# Patient Record
Sex: Male | Born: 1940 | Marital: Married | Smoking: Never smoker
Health system: Southern US, Community
[De-identification: ages and names within clinical notes are randomized; demographics above are authoritative.]

## PROBLEM LIST (undated history)

## (undated) DIAGNOSIS — E119 Type 2 diabetes mellitus without complications: Secondary | ICD-10-CM

## (undated) DIAGNOSIS — E039 Hypothyroidism, unspecified: Secondary | ICD-10-CM

## (undated) DIAGNOSIS — Z860100 Personal history of colon polyps, unspecified: Secondary | ICD-10-CM

## (undated) DIAGNOSIS — E079 Disorder of thyroid, unspecified: Secondary | ICD-10-CM

## (undated) DIAGNOSIS — M1990 Unspecified osteoarthritis, unspecified site: Secondary | ICD-10-CM

## (undated) DIAGNOSIS — I509 Heart failure, unspecified: Secondary | ICD-10-CM

## (undated) DIAGNOSIS — K635 Polyp of colon: Secondary | ICD-10-CM

## (undated) DIAGNOSIS — I10 Essential (primary) hypertension: Secondary | ICD-10-CM

## (undated) DIAGNOSIS — M2580 Other specified joint disorders, unspecified joint: Secondary | ICD-10-CM

## (undated) DIAGNOSIS — G4730 Sleep apnea, unspecified: Secondary | ICD-10-CM

## (undated) DIAGNOSIS — N400 Enlarged prostate without lower urinary tract symptoms: Secondary | ICD-10-CM

## (undated) DIAGNOSIS — L309 Dermatitis, unspecified: Secondary | ICD-10-CM

## (undated) DIAGNOSIS — B079 Viral wart, unspecified: Secondary | ICD-10-CM

## (undated) HISTORY — DX: Benign prostatic hyperplasia without lower urinary tract symptoms: N40.0

## (undated) HISTORY — PX: HEMORRHOID SURGERY: SHX153

## (undated) HISTORY — DX: Unspecified osteoarthritis, unspecified site: M19.90

## (undated) HISTORY — DX: Personal history of colon polyps, unspecified: Z86.0100

## (undated) HISTORY — PX: HERNIA REPAIR: SHX51

## (undated) HISTORY — PX: BUNIONECTOMY: SHX129

## (undated) HISTORY — DX: Dermatitis, unspecified: L30.9

## (undated) HISTORY — DX: Polyp of colon: K63.5

## (undated) HISTORY — DX: Type 2 diabetes mellitus without complications: E11.9

## (undated) HISTORY — DX: Sleep apnea, unspecified: G47.30

## (undated) HISTORY — DX: Disorder of thyroid, unspecified: E07.9

## (undated) HISTORY — PX: KNEE REPAIR EXTENSOR MECHANISM: SHX6613

## (undated) HISTORY — DX: Viral wart, unspecified: B07.9

## (undated) HISTORY — DX: Hypothyroidism, unspecified: E03.9

## (undated) HISTORY — DX: Other specified joint disorders, unspecified joint: M25.80

## (undated) HISTORY — PX: CHOLECYSTECTOMY: SHX55

## (undated) HISTORY — DX: Heart failure, unspecified: I50.9

## (undated) HISTORY — DX: Essential (primary) hypertension: I10

## (undated) HISTORY — DX: Personal history of colonic polyps: Z86.010

---

## 2017-08-16 HISTORY — PX: COLONOSCOPY: SHX174

## 2018-03-17 ENCOUNTER — Encounter: Payer: Self-pay | Admitting: Podiatry

## 2018-03-17 ENCOUNTER — Ambulatory Visit (INDEPENDENT_AMBULATORY_CARE_PROVIDER_SITE_OTHER): Payer: Medicare Other | Admitting: Podiatry

## 2018-03-17 VITALS — BP 142/76 | HR 64

## 2018-03-17 DIAGNOSIS — E119 Type 2 diabetes mellitus without complications: Secondary | ICD-10-CM

## 2018-03-17 DIAGNOSIS — Q828 Other specified congenital malformations of skin: Secondary | ICD-10-CM | POA: Diagnosis not present

## 2018-03-17 DIAGNOSIS — B351 Tinea unguium: Secondary | ICD-10-CM | POA: Diagnosis not present

## 2018-03-17 DIAGNOSIS — M216X9 Other acquired deformities of unspecified foot: Secondary | ICD-10-CM

## 2018-03-17 DIAGNOSIS — M79674 Pain in right toe(s): Secondary | ICD-10-CM | POA: Diagnosis not present

## 2018-03-17 DIAGNOSIS — M79675 Pain in left toe(s): Secondary | ICD-10-CM

## 2018-03-17 NOTE — Progress Notes (Signed)
This patient presents to the office with chief complaint of long thick nails and diabetic feet.  This patient  says there  is  no pain and discomfort in their feet.  This patient says there are long thick painful nails. He also has painful callus both feet. These nails are painful walking and wearing shoes.  Patient has no history of infection or drainage from both feet.  Patient is unable to  self treat his calluses and nails.  [place] to [place] from [place]. . This patient presents  to the office today for treatment of the  long nails and a foot evaluation due to history of  diabetes.  General Appearance  Alert, conversant and in no acute stress.  Vascular  Dorsalis pedis and posterior tibial  pulses are palpable  bilaterally.  Capillary return is within normal limits  bilaterally. Temperature is within normal limits  bilaterally.  Neurologic  Senn-Weinstein monofilament wire test within normal limits  Left.  LOPS diminished right.  . Muscle power within normal limits bilaterally.  Nails Thick disfigured discolored nails with subungual debris  from hallux to fifth toes bilaterally. No evidence of bacterial infection or drainage bilaterally.  Orthopedic  No limitations of motion of motion feet .  No crepitus or effusions noted.  HAV surgery left foot.  Severe HAV right foot.  Hammer toes 2-5  B/L    Skin  normotropic skin  noted bilaterally.  No signs of infections or ulcers noted.  Callus sub 1 right foot.  Porokeratosis forefoot  B/L.    Onychomycosis  Diabetes with no foot complications  IE  Debride nails x 10. Debride callus  B/l. A diabetic foot exam was performed and there is no evidence of any vascular or neurologic pathology. Prescribed Mobic 15 mg.  Told patient to be seen by Raiford Nobleick to add dispersion in his diabetic insoles.   RTC 3 months.   Helane GuntherGregory Kynsley Whitehouse DPM

## 2018-06-16 ENCOUNTER — Encounter: Payer: Self-pay | Admitting: Podiatry

## 2018-06-16 ENCOUNTER — Ambulatory Visit (INDEPENDENT_AMBULATORY_CARE_PROVIDER_SITE_OTHER): Payer: Medicare Other | Admitting: Podiatry

## 2018-06-16 DIAGNOSIS — M79674 Pain in right toe(s): Secondary | ICD-10-CM | POA: Diagnosis not present

## 2018-06-16 DIAGNOSIS — Q828 Other specified congenital malformations of skin: Secondary | ICD-10-CM | POA: Diagnosis not present

## 2018-06-16 DIAGNOSIS — B351 Tinea unguium: Secondary | ICD-10-CM | POA: Diagnosis not present

## 2018-06-16 DIAGNOSIS — E119 Type 2 diabetes mellitus without complications: Secondary | ICD-10-CM | POA: Diagnosis not present

## 2018-06-16 DIAGNOSIS — M79675 Pain in left toe(s): Secondary | ICD-10-CM

## 2018-06-16 DIAGNOSIS — M216X9 Other acquired deformities of unspecified foot: Secondary | ICD-10-CM

## 2018-06-16 DIAGNOSIS — M2011 Hallux valgus (acquired), right foot: Secondary | ICD-10-CM

## 2018-06-16 MED ORDER — MELOXICAM 15 MG PO TABS: 15.0000 mg | ORAL_TABLET | Freq: Every day | ORAL | 0 refills | Status: AC

## 2018-06-16 NOTE — Progress Notes (Signed)
This patient presents the office today for continued preventative foot care services.  He says that the calluses on the bottom of both feet have become extremely painful.  He says he was unable to consider surgical correction due to his wife's health.  He says that he is now ready to discuss surgery of his feet with a Careers adviser.  He says that the calluses are continued to be painful walking and wearing his shoes.  He also talks about straightening the great toe on his right foot.  He presents the office today and preventative foot care services were provided. Patient is diabetic and says it runs aboyt 115.  No  General Appearance  Alert, conversant and in no acute stress.  Vascular  Dorsalis pedis and posterior tibial  pulses are palpable  bilaterally.  Capillary return is within normal limits  bilaterally. Temperature is within normal limits  bilaterally.  Neurologic  Senn-Weinstein monofilament wire test within normal limits left.  Saint Weinstein monofilament wire test was diminished right foot. . Muscle power within normal limits bilaterally.  Nails Thick disfigured discolored nails with subungual debris  from hallux to fifth toes bilaterally. No evidence of bacterial infection or drainage bilaterally.  Orthopedic  No limitations of motion  feet .  No crepitus or effusions noted.  HAV first MPJ right foot.  Plantarflexed fifth metatarsal heads bilaterally.    Skin  normotropic skin with  noted bilaterally.  No signs of infections or ulcers noted.  Porokeratosis sub 5th metatarsal B/L  Callus sub 1st MPJ  Right.  Onychomycosis  Porokeratosis  Sub 5th metatarsal B/L.  ROV.  Discussed surgical correction with this patient.  This patient will be referred to Dr. Charlsie Merles for surgical consult.  Debridement of nails  X 10.  Debride porokeratosis sub 5th  B/L.  We discussed injection therapy and decided against injection therapy until after evaluation by Dr. Charlsie Merles was performed.  RTC 3 months for preventative  foot care services.   Helane Gunther DPM

## 2018-06-22 ENCOUNTER — Ambulatory Visit: Payer: Medicare Other | Admitting: Podiatry

## 2018-07-04 ENCOUNTER — Telehealth: Payer: Self-pay | Admitting: *Deleted

## 2018-07-04 NOTE — Telephone Encounter (Signed)
error 

## 2018-07-05 ENCOUNTER — Ambulatory Visit (INDEPENDENT_AMBULATORY_CARE_PROVIDER_SITE_OTHER): Payer: Medicare Other

## 2018-07-05 ENCOUNTER — Ambulatory Visit (INDEPENDENT_AMBULATORY_CARE_PROVIDER_SITE_OTHER): Payer: Medicare Other | Admitting: Podiatry

## 2018-07-05 ENCOUNTER — Encounter: Payer: Self-pay | Admitting: Podiatry

## 2018-07-05 DIAGNOSIS — M2011 Hallux valgus (acquired), right foot: Secondary | ICD-10-CM

## 2018-07-05 DIAGNOSIS — M21622 Bunionette of left foot: Secondary | ICD-10-CM | POA: Diagnosis not present

## 2018-07-05 DIAGNOSIS — M21621 Bunionette of right foot: Secondary | ICD-10-CM

## 2018-07-05 DIAGNOSIS — M2012 Hallux valgus (acquired), left foot: Secondary | ICD-10-CM

## 2018-07-05 NOTE — Progress Notes (Signed)
Subjective:   Patient ID: Chad Gibson, male   DOB: 77 y.o.   MRN: [id number]   HPI Patient presents stating this bunion on my right is really bothering me and these lesions underneath the outside of both feet are so sore I have trouble walking or wearing shoe gear and they are just getting worse and I would like to get them fixed.  Patient states sugars been under excellent control with his sugar running around 100 and his A1c around 6-6-1/2   ROS      Objective:  Physical Exam  Neurovascular status intact muscle strength is adequate range of motion within normal limits with patient found to have moderate structural bunion deformity right with keratotic lesion and deformity of the right big toe which does not hurt but does lean against the second toe.  Is found to have severe keratotic lesion sub-fifth metatarsal head both feet that are on the bone with the bone being quite prominent with no fat pad plantarly and a lot of pain with palpation     Assessment:  Moderate structural bunion deformity right along with plantarflexed fifth metatarsal bilateral keratotic lesions that are painful     Plan:  H&P conditions reviewed at this time.  I recommended bunion correction I did discuss bunion procedure to try to solve this problem explaining that will not completely resolve his issues with the first MPJ but I am hoping will be of help and also fifth metatarsal head resection.  I educated him all of this and patient is scheduled for consult January is tentatively scheduled for surgery mid January  X-rays indicate previous bunion surgery left with screw fixation with structural bunion deformity right with significant deformity of the right hallux that I do not consider correctable at the current time.  Appears to have plantarflexed fifth metatarsals bilateral

## 2018-08-21 ENCOUNTER — Encounter: Payer: Self-pay | Admitting: *Deleted

## 2018-08-21 NOTE — Progress Notes (Signed)
Chad Gibson came by the office to reschedule his surgery from January 14 to March 24.  I also rescheduled his preoperative appointment from August 23, 2018 to October 23, 2018.

## 2018-08-23 ENCOUNTER — Ambulatory Visit: Payer: Medicare Other | Admitting: Podiatry

## 2018-09-07 ENCOUNTER — Other Ambulatory Visit: Payer: Medicare Other

## 2018-09-12 ENCOUNTER — Other Ambulatory Visit: Payer: Self-pay

## 2018-09-12 ENCOUNTER — Telehealth: Payer: Self-pay | Admitting: Pulmonary Disease

## 2018-09-12 ENCOUNTER — Emergency Department (HOSPITAL_COMMUNITY): Payer: Medicare Other

## 2018-09-12 ENCOUNTER — Emergency Department (HOSPITAL_COMMUNITY)
Admission: EM | Admit: 2018-09-12 | Discharge: 2018-09-12 | Disposition: A | Discharge status: Home / Self Care | Payer: Medicare Other | Attending: Emergency Medicine | Admitting: Emergency Medicine

## 2018-09-12 ENCOUNTER — Encounter (HOSPITAL_COMMUNITY): Payer: Self-pay | Admitting: Emergency Medicine

## 2018-09-12 DIAGNOSIS — I10 Essential (primary) hypertension: Secondary | ICD-10-CM | POA: Insufficient documentation

## 2018-09-12 DIAGNOSIS — R110 Nausea: Secondary | ICD-10-CM | POA: Diagnosis not present

## 2018-09-12 DIAGNOSIS — R260 Ataxic gait: Secondary | ICD-10-CM | POA: Diagnosis not present

## 2018-09-12 DIAGNOSIS — R42 Dizziness and giddiness: Secondary | ICD-10-CM | POA: Diagnosis not present

## 2018-09-12 DIAGNOSIS — R5383 Other fatigue: Secondary | ICD-10-CM | POA: Insufficient documentation

## 2018-09-12 DIAGNOSIS — Z79899 Other long term (current) drug therapy: Secondary | ICD-10-CM | POA: Diagnosis not present

## 2018-09-12 LAB — BASIC METABOLIC PANEL
Anion gap: 8 (ref 5–15)
BUN: 17 mg/dL (ref 8–23)
CALCIUM: 9.7 mg/dL (ref 8.9–10.3)
CO2: 25 mmol/L (ref 22–32)
Chloride: 104 mmol/L (ref 98–111)
Creatinine, Ser: 0.93 mg/dL (ref 0.61–1.24)
GFR calc Af Amer: >60 mL/min (ref >60)
GFR calc non Af Amer: >60 mL/min (ref >60)
Glucose, Bld: 128 mg/dL — ABNORMAL HIGH (ref 70–99)
Potassium: 3.9 mmol/L (ref 3.5–5.1)
Sodium: 137 mmol/L (ref 135–145)

## 2018-09-12 LAB — URINALYSIS, ROUTINE W REFLEX MICROSCOPIC
Bilirubin Urine: NEGATIVE
Glucose, UA: NEGATIVE mg/dL
HGB URINE DIPSTICK: NEGATIVE
Ketones, ur: NEGATIVE mg/dL
Leukocytes, UA: NEGATIVE
Nitrite: NEGATIVE
Protein, ur: NEGATIVE mg/dL
Specific Gravity, Urine: 1.011 (ref 1.005–1.030)
pH: 5 (ref 5.0–8.0)

## 2018-09-12 LAB — CBC
HCT: 45 % (ref 39.0–52.0)
Hemoglobin: 14.9 g/dL (ref 13.0–17.0)
MCH: 25.3 pg — ABNORMAL LOW (ref 26.0–34.0)
MCHC: 33.1 g/dL (ref 30.0–36.0)
MCV: 76.5 fL — AB (ref 80.0–100.0)
Platelets: 193 10*3/uL (ref 150–400)
RBC: 5.88 MIL/uL — ABNORMAL HIGH (ref 4.22–5.81)
RDW: 17.4 % — ABNORMAL HIGH (ref 11.5–15.5)
WBC: 9.1 10*3/uL (ref 4.0–10.5)
nRBC: 0 % (ref 0.0–0.2)

## 2018-09-12 MED ORDER — ONDANSETRON HCL 4 MG PO TABS: 4.0000 mg | ORAL_TABLET | Freq: Four times a day (QID) | ORAL | 0 refills | Status: DC

## 2018-09-12 MED ORDER — LORAZEPAM 2 MG/ML IJ SOLN
1.0000 mg | Freq: Once | INTRAMUSCULAR | Status: AC
  Administered 2018-09-12: 1 mg via INTRAVENOUS
  Filled 2018-09-12: qty 1

## 2018-09-12 MED ORDER — SODIUM CHLORIDE 0.9% FLUSH
3.0000 mL | Freq: Once | INTRAVENOUS | Status: AC
  Administered 2018-09-12: 3 mL via INTRAVENOUS

## 2018-09-12 MED ORDER — MECLIZINE HCL 25 MG PO TABS: 25.0000 mg | ORAL_TABLET | Freq: Three times a day (TID) | ORAL | 0 refills | Status: AC | PRN

## 2018-09-12 MED ORDER — ONDANSETRON 4 MG PO TBDP
4.0000 mg | ORAL_TABLET | Freq: Once | ORAL | Status: AC
  Administered 2018-09-12: 4 mg via ORAL
  Filled 2018-09-12: qty 1

## 2018-09-12 MED ORDER — MECLIZINE HCL 25 MG PO TABS
25.0000 mg | ORAL_TABLET | Freq: Once | ORAL | Status: AC
  Administered 2018-09-12: 25 mg via ORAL
  Filled 2018-09-12: qty 1

## 2018-09-12 NOTE — ED Notes (Signed)
Patient transported to MRI 

## 2018-09-12 NOTE — ED Triage Notes (Addendum)
Onset 3 days ago developed balance issues stating when walking felt like he was going to the right. Continued over the weekend and today. States at times has nausea and when bending over have general abdominal discomfort. Alert answering and following commands appropriate. States seen 18 months ago for same symptoms and all symptoms resolved.

## 2018-09-12 NOTE — Discharge Instructions (Addendum)
Return here as needed. Follow up with your primary doctor. Increase your fluid intake. °

## 2018-09-12 NOTE — ED Notes (Signed)
Patient verbalizes understanding of discharge instructions. Opportunity for questioning and answers were provided. Armband removed by staff, pt discharged from ED in wheelchair.  

## 2018-09-12 NOTE — ED Notes (Signed)
ED Provider at bedside. 

## 2018-09-12 NOTE — ED Provider Notes (Signed)
[facility] EMERGENCY DEPARTMENT Provider Note   CSN: [id number] Arrival date & time: 09/12/18  7588     History   Chief Complaint Chief Complaint  Patient presents with  . Nausea  . Abdominal Pain  . Fatigue    HPI Chad Gibson is a 78 y.o. male.  HPI Patient presents to the emergency department with dizziness that started 2 days ago.  The patient states that he had this 1 time previously about a year and a half ago and had an extensive work-up.  The patient states that they were unable to find a definitive source.  Patient states that nothing seems make the condition better but certain movements and positions make it worse.  Patient states that he did not take any medications prior to arrival for symptoms.  The patient denies chest pain, shortness of breath, headache,blurred vision, neck pain, fever, cough, weakness, numbness,anorexia, edema, abdominal pain, nausea, vomiting, diarrhea, rash, back pain, dysuria, hematemesis, bloody stool, near syncope, or syncope. Past Medical History:  Diagnosis Date  . Arthritis   . Diabetes mellitus without complication (HCC)   . Eczema   . Hypertension   . Sesamoiditis   . Warts    chronic    There are no active problems to display for this patient.   Past Surgical History:  Procedure Laterality Date  . CHOLECYSTECTOMY    . KNEE REPAIR EXTENSOR MECHANISM          Home Medications    Prior to Admission medications   Medication Sig Start Date End Date Taking? Authorizing Provider  aspirin EC 81 MG tablet Take 81 mg by mouth daily.   Yes [provider]  Cholecalciferol (VITAMIN D) 50 MCG (2000 UT) tablet Take 2,000 Units by mouth daily. 08/14/18  Yes [provider]  docusate calcium (SURFAK) 240 MG capsule Take 240 mg by mouth as needed for mild constipation.   Yes [provider]  finasteride (PROSCAR) 5 MG tablet Take 5 mg by mouth at bedtime.    Yes [provider]    Glycerin-Hypromellose-PEG 400 ([facility] DRY EYE RELIEF) 0.2-0.2-1 % SOLN Place 1 drop into both eyes as needed (Dry eye). refresh   Yes [provider]  hydrochlorothiazide (HYDRODIURIL) 25 MG tablet Take 12.5 mg by mouth daily.  08/14/18  Yes [provider]  hydrocortisone (ANUSOL-HC) 25 MG suppository Place 25 mg rectally as needed for hemorrhoids or anal itching.    Yes [provider]  lactobacillus acidophilus & bulgar (LACTINEX) chewable tablet Chew 1 tablet by mouth daily.   Yes [provider]  Levothyroxine Sodium 100 MCG CAPS Take 100 mcg by mouth daily before breakfast.    Yes [provider]  losartan (COZAAR) 100 MG tablet Take 100 mg by mouth daily. 08/14/18  Yes [provider]  meloxicam (MOBIC) 15 MG tablet Take 1 tablet (15 mg total) by mouth daily. Patient taking differently: Take 15 mg by mouth as needed for pain.  06/16/18  Yes Helane Gunther, DPM  metFORMIN (GLUCOPHAGE) 500 MG tablet Take 500 mg by mouth 2 (two) times daily with a meal.    Yes [provider]  metoprolol tartrate (LOPRESSOR) 50 MG tablet Take 25 mg by mouth 2 (two) times daily. 08/14/18  Yes [provider]  Multiple Vitamin (MULTIVITAMIN WITH MINERALS) TABS tablet Take 1 tablet by mouth daily. Centrum Silver   Yes [provider]  tamsulosin (FLOMAX) 0.4 MG CAPS capsule Take 0.4 mg by mouth  at bedtime.    Yes [provider]  vitamin C (ASCORBIC ACID) 500 MG tablet Take 500 mg by mouth daily.   Yes [provider]  glucose blood test strip 1 each by Other route as needed for other. Use as instructed    [provider]    Family History No family history on file.  Social History Social History   Tobacco Use  . Smoking status: Never Smoker  . Smokeless tobacco: Never Used  Substance Use Topics  . Alcohol use: Never    Frequency: Never  . Drug use: Never     Allergies   Ace  inhibitors   Review of Systems Review of Systems All other systems negative except as documented in the HPI. All pertinent positives and negatives as reviewed in the HPI.  Physical Exam Updated Vital Signs BP (!) 153/94 (BP Location: Left Arm)   Pulse 79   Temp 97.6 F (36.4 C) (Oral)   Resp 16   Ht 6' 1.5" (1.867 m)   Wt 96.2 kg   SpO2 98%   BMI 27.59 kg/m   Physical Exam Vitals signs and nursing note reviewed.  Constitutional:      General: He is not in acute distress.    Appearance: He is well-developed.  HENT:     Head: Normocephalic and atraumatic.  Eyes:     Pupils: Pupils are equal, round, and reactive to light.  Neck:     Musculoskeletal: Normal range of motion and neck supple.  Cardiovascular:     Rate and Rhythm: Normal rate and regular rhythm.     Heart sounds: Normal heart sounds. No murmur. No friction rub. No gallop.   Pulmonary:     Effort: Pulmonary effort is normal. No respiratory distress.     Breath sounds: Normal breath sounds. No wheezing.  Abdominal:     General: Bowel sounds are normal. There is no distension.     Palpations: Abdomen is soft.     Tenderness: There is no abdominal tenderness.  Skin:    General: Skin is warm and dry.     Capillary Refill: Capillary refill takes less than 2 seconds.     Findings: No erythema or rash.  Neurological:     Mental Status: He is alert and oriented to person, place, and time.     GCS: GCS eye subscore is 4. GCS verbal subscore is 5. GCS motor subscore is 6.     Cranial Nerves: No cranial nerve deficit.     Sensory: Sensation is intact.     Motor: Motor function is intact. No weakness or abnormal muscle tone.     Coordination: Coordination is intact. Coordination normal. Heel to Shin Test normal.  Psychiatric:        Behavior: Behavior normal.      ED Treatments / Results  Labs (all labs ordered are listed, but only abnormal results are displayed) Labs Reviewed  BASIC METABOLIC PANEL -  Abnormal; Notable for the following components:      Result Value   Glucose, Bld 128 (*)    All other components within normal limits  CBC - Abnormal; Notable for the following components:   RBC 5.88 (*)    MCV 76.5 (*)    MCH 25.3 (*)    RDW 17.4 (*)    All other components within normal limits  URINE CULTURE  URINALYSIS, ROUTINE W REFLEX MICROSCOPIC  URINALYSIS, ROUTINE W REFLEX MICROSCOPIC    EKG None  Radiology Mr Brain Wo Contrast (neuro Protocol)  Result Date: 09/12/2018 CLINICAL DATA:  Initial evaluation for acute ataxia, stroke suspected. EXAM: MRI HEAD WITHOUT CONTRAST TECHNIQUE: Multiplanar, multiecho pulse sequences of the brain and surrounding structures were obtained without intravenous contrast. COMPARISON:  None. FINDINGS: Brain: Cerebral volume within normal limits for patient age. Patchy T2/FLAIR hyperintensity within the periventricular white matter, most consistent with chronic micro vessel ischemic disease, mild for age. No abnormal foci of restricted diffusion to suggest acute or subacute ischemia. Gray-white matter differentiation well maintained. No encephalomalacia to suggest chronic infarction. No foci of susceptibility artifact to suggest acute or chronic intracranial hemorrhage. Mass lesion, midline shift or mass effect. No hydrocephalus. No extra-axial fluid collection. Major dural sinuses are grossly patent. Pituitary gland and suprasellar region are normal. Midline structures intact and normal. Vascular: Major intracranial vascular flow voids well maintained and normal in appearance. Skull and upper cervical spine: Craniocervical junction normal. Visualized upper cervical spine within normal limits. Bone marrow signal intensity normal. No scalp soft tissue abnormality. Small lipoma noted at the right forehead. Sinuses/Orbits: Globes and orbital soft tissues within normal limits. Paranasal sinuses are clear. No mastoid effusion. Inner ear structures normal. Other:  None. IMPRESSION: 1. No acute intracranial infarct or other abnormality. 2. Mild chronic microvascular ischemic disease for age. Electronically Signed   By: Rise Mu M.D.   On: 09/12/2018 20:33    Procedures Procedures (including critical care time)  Medications Ordered in ED Medications  sodium chloride flush (NS) 0.9 % injection 3 mL (3 mLs Intravenous Given 09/12/18 1817)  LORazepam (ATIVAN) injection 1 mg (1 mg Intravenous Given 09/12/18 1931)     Initial Impression / Assessment and Plan / ED Course  I have reviewed the triage vital signs and the nursing notes.  Pertinent labs & imaging results that were available during my care of the patient were reviewed by me and considered in my medical decision making (see chart for details).     Patient has a negative MRI along with a normal neurological examination.  I was concerned the patient may have had a posterior circulation stroke causing the symptoms.  The MRI did not yield any significant results.  I advised the patient of the results and all questions were answered.  Was advised the patient to follow-up with a primary doctor.  Told to return here as needed.  Final Clinical Impressions(s) / ED Diagnoses   Final diagnoses:  None    ED Discharge Orders    None       Kyra Manges 09/12/18 2055    Arby Barrette, MD 09/12/18 2108

## 2018-09-12 NOTE — ED Notes (Signed)
Pt states he is still unable to urinate.

## 2018-09-12 NOTE — Telephone Encounter (Signed)
Called Dr. Clarene Duke to get some information on the pt as we are not a treating office for this pt. When speaking to Dr. Clarene Duke she states that she is unfamiliar with this pt and did not call on him. Message will be closed at this time.

## 2018-09-13 LAB — URINE CULTURE: CULTURE: NO GROWTH

## 2018-09-22 ENCOUNTER — Ambulatory Visit: Payer: Medicare Other | Admitting: Podiatry

## 2018-10-23 ENCOUNTER — Ambulatory Visit: Payer: Medicare Other | Admitting: Podiatry

## 2018-11-13 ENCOUNTER — Other Ambulatory Visit: Payer: Medicare Other

## 2019-07-25 IMAGING — MR MR HEAD W/O CM
10 of 11 series · 43 of 48 positions shown · non-contrast
Comparison: None.

CLINICAL DATA: Initial evaluation for acute ataxia, stroke
suspected.

EXAM:
MRI HEAD WITHOUT CONTRAST
TECHNIQUE: Multiplanar, multiecho pulse sequences of the brain and surrounding
structures were obtained without intravenous contrast.

[Series 5: DWI · axial · 3.0mm · 0.88mm/px · z∈[-59,+88]mm · 10 of 100 slices shown (1 of 4)]
[im 1/100]
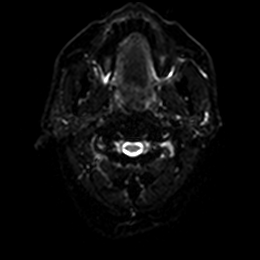
[im 12/100]
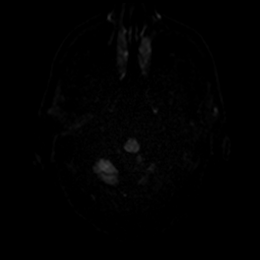
[im 23/100]
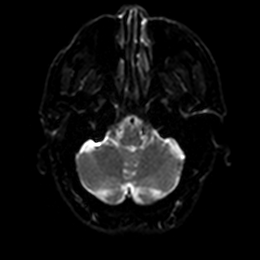
[im 34/100]
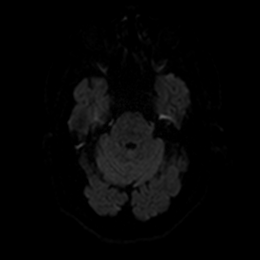
[im 45/100]
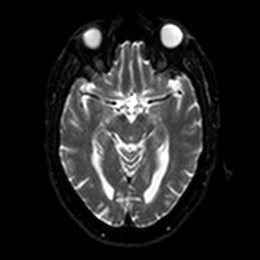
[im 56/100]
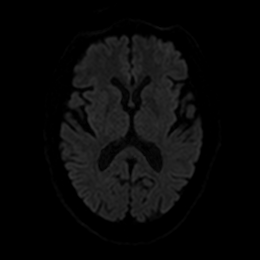
[im 67/100]
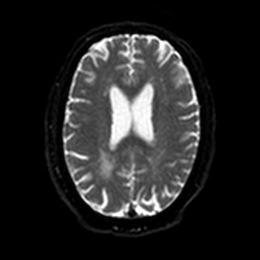
[im 78/100]
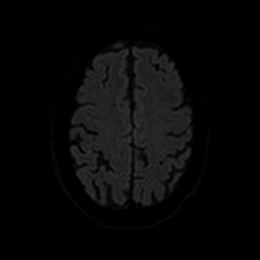
[im 89/100]
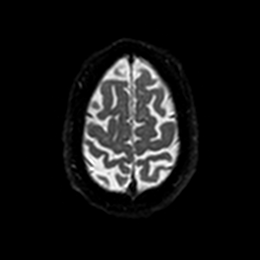
[im 100/100]
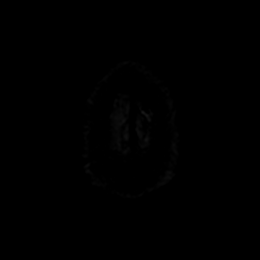

[Series 6: DWI · axial · 3.0mm · 0.88mm/px · z∈[-59,+88]mm · 5 of 50 slices shown (2 of 4)]
[im 1/50]
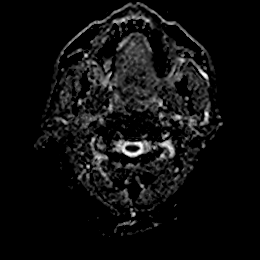
[im 13/50]
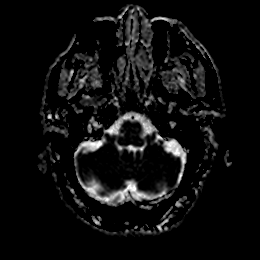
[im 25/50]
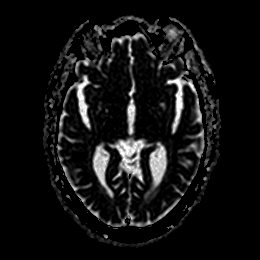
[im 37/50]
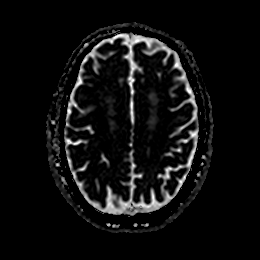
[im 50/50]
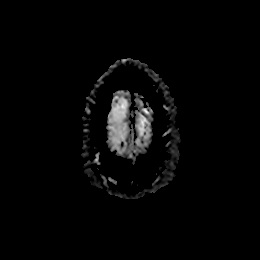

[Series 7: DWI · coronal · 4.0mm · 0.88mm/px · 6 of 70 slices shown (3 of 4)]
[im 1/70]
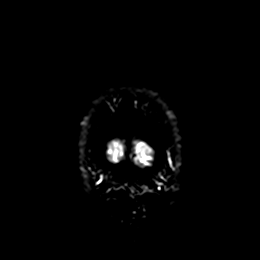
[im 14/70]
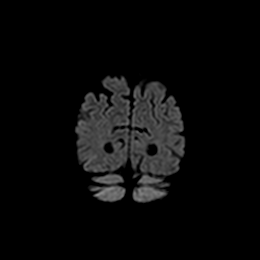
[im 28/70]
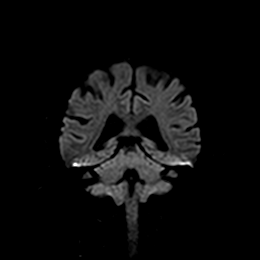
[im 42/70]
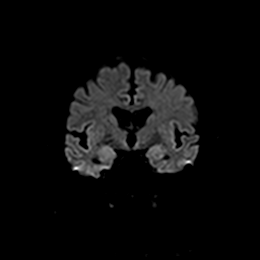
[im 56/70]
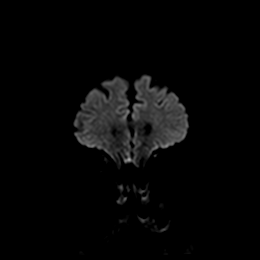
[im 70/70]
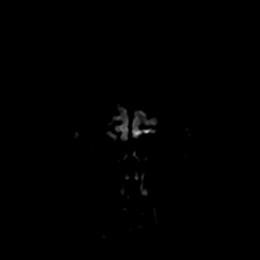

[Series 8: DWI · coronal · 4.0mm · 0.88mm/px · 3 of 35 slices shown (4 of 4)]
[im 1/35]
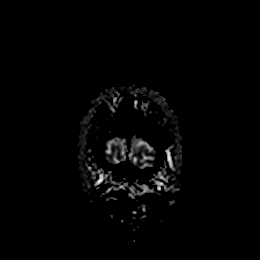
[im 18/35]
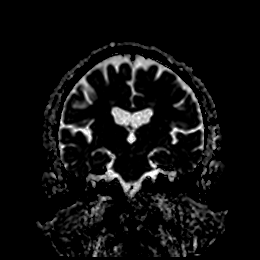
[im 35/35]
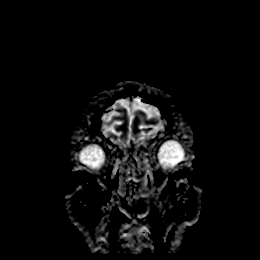

[Series 9: T1 · sagittal · 5.0mm · 0.75mm/px · 2 of 23 slices shown]
[im 1/23]
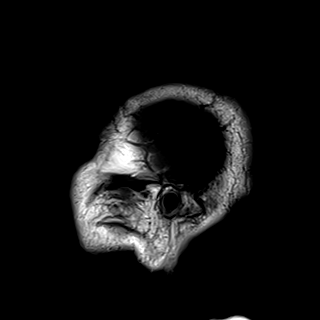
[im 23/23]
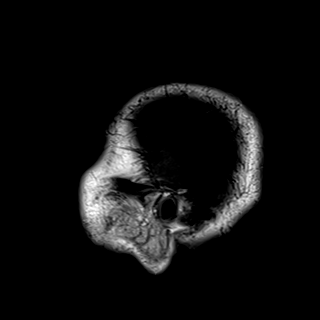

[Series 10: T2 · axial · 5.0mm · 0.72mm/px · z∈[-53,+90]mm · 2 of 25 slices shown]
[im 1/25]
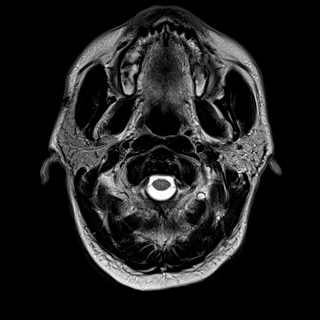
[im 25/25]
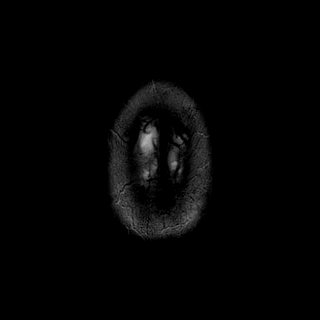

[Series 11: FLAIR · axial · 5.0mm · 0.45mm/px · z∈[-55,+88]mm · 2 of 25 slices shown]
[im 1/25]
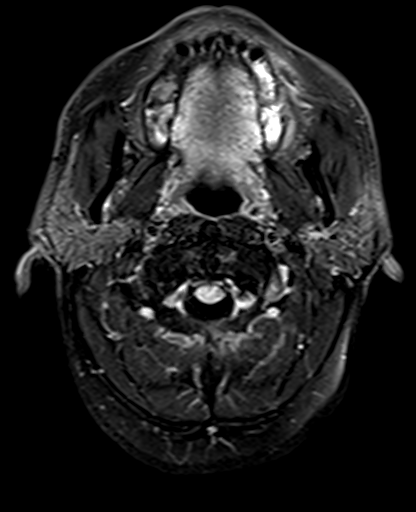
[im 25/25]
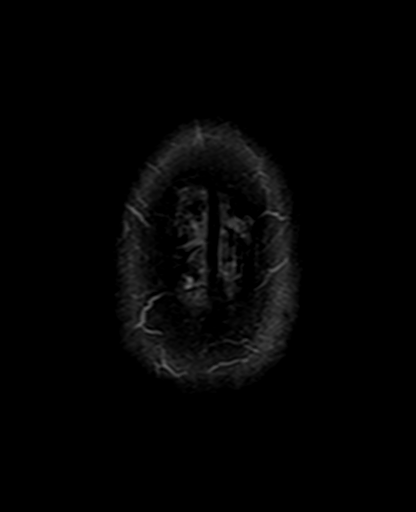

[Series 13: pha_images · axial · 3.0mm · 0.90mm/px · z∈[-66,+105]mm · 5 of 58 slices shown]
[im 1/58]
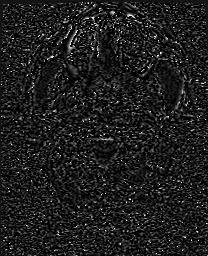
[im 15/58]
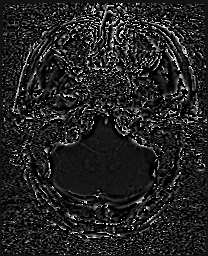
[im 29/58]
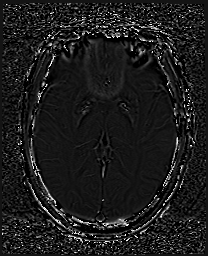
[im 43/58]
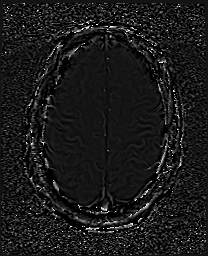
[im 58/58]
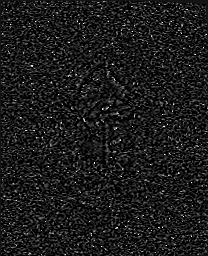

[Series 14: swi_images · axial · 3.0mm · 0.90mm/px · z∈[-66,+111]mm · 5 of 60 slices shown]
[im 1/60]
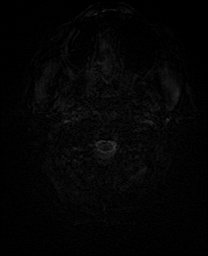
[im 15/60]
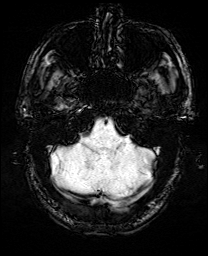
[im 30/60]
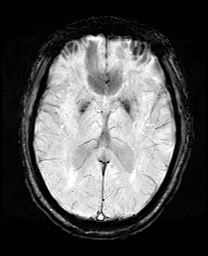
[im 45/60]
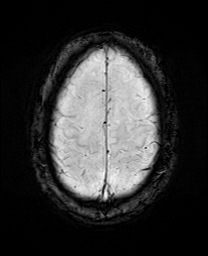
[im 60/60]
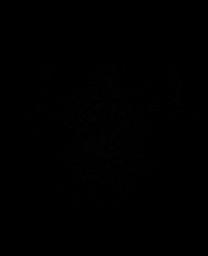

[Series 17: T2 post-contrast · coronal · 5.0mm · 0.72mm/px · 3 of 28 slices shown]
[im 1/28]
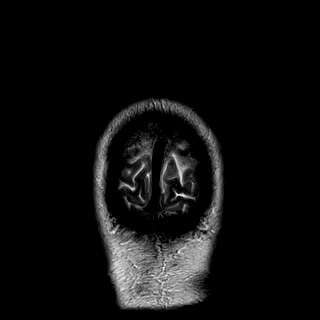
[im 14/28]
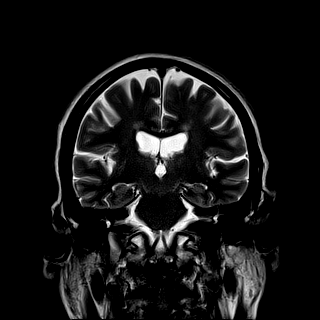
[im 28/28]
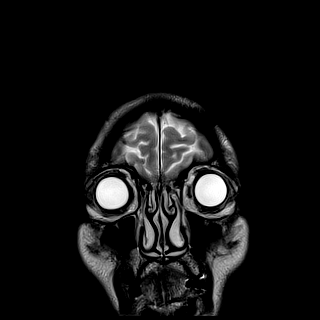

[43 of 48 positions shown; findings below may reference images not displayed]

FINDINGS: Brain: Cerebral volume within normal limits for patient age. Patchy
T2/FLAIR hyperintensity within the periventricular white matter,
most consistent with chronic micro vessel ischemic disease, mild for
age.

No abnormal foci of restricted diffusion to suggest acute or
subacute ischemia. Gray-white matter differentiation well
maintained. No encephalomalacia to suggest chronic infarction. No
foci of susceptibility artifact to suggest acute or chronic
intracranial hemorrhage.

Mass lesion, midline shift or mass effect. No hydrocephalus. No
extra-axial fluid collection. Major dural sinuses are grossly
patent.

Pituitary gland and suprasellar region are normal. Midline
structures intact and normal.

Vascular: Major intracranial vascular flow voids well maintained and
normal in appearance.

Skull and upper cervical spine: Craniocervical junction normal.
Visualized upper cervical spine within normal limits. Bone marrow
signal intensity normal. No scalp soft tissue abnormality. Small
lipoma noted at the right forehead.

Sinuses/Orbits: Globes and orbital soft tissues within normal
limits. Paranasal sinuses are clear. No mastoid effusion. Inner ear
structures normal.

Other: None.
IMPRESSION: 1. No acute intracranial infarct or other abnormality.
2. Mild chronic microvascular ischemic disease for age.

## 2020-04-16 HISTORY — PX: PACEMAKER IMPLANT: EP1218

## 2020-09-26 ENCOUNTER — Encounter: Payer: Self-pay | Admitting: Gastroenterology

## 2020-10-14 ENCOUNTER — Encounter: Payer: Self-pay | Admitting: Gastroenterology

## 2020-10-14 ENCOUNTER — Ambulatory Visit (INDEPENDENT_AMBULATORY_CARE_PROVIDER_SITE_OTHER): Payer: Medicare Other | Admitting: Gastroenterology

## 2020-10-14 VITALS — BP 119/73 | HR 70 | Ht 73.0 in | Wt 215.0 lb

## 2020-10-14 DIAGNOSIS — K5909 Other constipation: Secondary | ICD-10-CM

## 2020-10-14 DIAGNOSIS — K648 Other hemorrhoids: Secondary | ICD-10-CM | POA: Diagnosis not present

## 2020-10-14 MED ORDER — HYDROCORTISONE ACETATE 25 MG RE SUPP: 25.0000 mg | Freq: Every day | RECTAL | 2 refills | Status: AC

## 2020-10-14 NOTE — Progress Notes (Signed)
I agree with the above note, plan 

## 2020-10-14 NOTE — Patient Instructions (Addendum)
If you are age 81 or older, your body mass index should be between 23-30. Your Body mass index is 28.37 kg/m. If this is out of the aforementioned range listed, please consider follow up with your Primary Care Provider.  If you are age 48 or younger, your body mass index should be between 19-25. Your Body mass index is 28.37 kg/m. If this is out of the aformentioned range listed, please consider follow up with your Primary Care Provider.   Start Benefiber or Citrucel 2 teaspoons in 8 ounces of liquid daily and may increase to twice daily if tolerated.   START Miralax 1 capful in 8 ounces of water or juice daily.  Use Anusol suppositories 1 suppository at bed time for 10 days   Follow up as needed.  Thank you for choosing me and [facility].  Doug Sou, PA-C

## 2020-10-14 NOTE — Progress Notes (Signed)
10/14/2020 Chad Gibson [id number] 01-24-1941   HISTORY OF PRESENT ILLNESS: This is an 80 year old male who is new to our office.  He was referred here by the [facility] for evaluation regarding constipation and hemorrhoids.  He tells me that he had a colonoscopy in January 2019 in [place].  We are going to request those records.  They moved here in July 2019.  He follows through the [place] for most of his medical care.  He reports that he is currently using fiber Gummies for his constipation.  He also takes a daily probiotic.  Uses MiraLAX on an as-needed basis.  Reports some discomfort with burning and occasional bright red blood per rectum that he associates with hemorrhoids.  He has not seen any blood recently, the last was probably about 1.5 months ago.  He was prescribed Anusol suppositories, but just uses those intermittently.  He reports history of hemorrhoid surgery in 2014.  Labs from September 16, 2020 show white blood cell count 7.10, hemoglobin 13.8 g, platelets 160.  Electrolytes, renal function, and LFTs normal.   Past Medical History:  Diagnosis Date  . Arthritis   . Arthritis   . BPH (benign prostatic hyperplasia)   . CHF (congestive heart failure) (HCC)   . Colon polyp   . Diabetes mellitus without complication (HCC)   . Eczema   . History of colon polyps   . Hypertension   . Hypertension   . Hypothyroidism   . Sesamoiditis   . Sleep apnea   . Thyroid disease   . Warts    chronic   Past Surgical History:  Procedure Laterality Date  . BUNIONECTOMY    . CHOLECYSTECTOMY    . COLONOSCOPY  2019  . HEMORRHOID SURGERY    . HERNIA REPAIR    . KNEE REPAIR EXTENSOR MECHANISM    . PACEMAKER IMPLANT  04/16/2020   Medtronic     reports that he has never smoked. He has never used smokeless tobacco. He reports that he does not drink alcohol and does not use drugs. family history includes Diabetes in his brother; Lung cancer in his sister. Allergies  Allergen Reactions  . Ace  Inhibitors       Outpatient Encounter Medications as of 10/14/2020  Medication Sig  . aspirin EC 81 MG tablet Take 81 mg by mouth daily.  . carvedilol (COREG) 12.5 MG tablet Take 12.5 mg by mouth 2 (two) times daily with a meal.  . Cholecalciferol (VITAMIN D) 50 MCG (2000 UT) tablet Take 2,000 Units by mouth daily.  . finasteride (PROSCAR) 5 MG tablet Take 5 mg by mouth daily.  Marland Kitchen glucose blood test strip 1 each by Other route as needed for other. Use as instructed  . Glycerin-Hypromellose-PEG 400 0.2-0.2-1 % SOLN Place 1 drop into both eyes as needed (Dry eye). refresh - as needed  . lactobacillus acidophilus & bulgar (LACTINEX) chewable tablet Chew 1 tablet by mouth daily.  . Levothyroxine Sodium 100 MCG CAPS Take 100 mcg by mouth daily before breakfast.   . meclizine (ANTIVERT) 25 MG tablet Take 1 tablet (25 mg total) by mouth 3 (three) times daily as needed for dizziness.  . meloxicam (MOBIC) 15 MG tablet Take 1 tablet (15 mg total) by mouth daily. (Patient taking differently: Take 15 mg by mouth as needed for pain.)  . metFORMIN (GLUCOPHAGE) 500 MG tablet Take 500 mg by mouth 2 (two) times daily with a meal.   . Multiple Vitamin (MULTIVITAMIN WITH MINERALS)  TABS tablet Take 1 tablet by mouth daily. Centrum Silver  . polyethylene glycol (MIRALAX / GLYCOLAX) 17 g packet Take 17 g by mouth daily. PRN  . sacubitril-valsartan (ENTRESTO) 49-51 MG Take 1 tablet by mouth 2 (two) times daily.  . tamsulosin (FLOMAX) 0.4 MG CAPS capsule Take 0.4 mg by mouth at bedtime.   . vitamin C (ASCORBIC ACID) 500 MG tablet Take 500 mg by mouth daily.  . [DISCONTINUED] hydrocortisone (ANUSOL-HC) 25 MG suppository Place 25 mg rectally as needed for hemorrhoids or anal itching.   . hydrocortisone (ANUSOL-HC) 25 MG suppository Place 1 suppository (25 mg total) rectally at bedtime.  . [DISCONTINUED] docusate calcium (SURFAK) 240 MG capsule Take 240 mg by mouth as needed for mild constipation. (Patient not taking:  Reported on 10/14/2020)  . [DISCONTINUED] finasteride (PROSCAR) 5 MG tablet Take 5 mg by mouth at bedtime.  (Patient not taking: Reported on 10/14/2020)  . [DISCONTINUED] hydrochlorothiazide (HYDRODIURIL) 25 MG tablet Take 12.5 mg by mouth daily.  (Patient not taking: Reported on 10/14/2020)  . [DISCONTINUED] losartan (COZAAR) 100 MG tablet Take 100 mg by mouth daily. (Patient not taking: Reported on 10/14/2020)  . [DISCONTINUED] metoprolol tartrate (LOPRESSOR) 50 MG tablet Take 25 mg by mouth 2 (two) times daily. (Patient not taking: Reported on 10/14/2020)  . [DISCONTINUED] ondansetron (ZOFRAN) 4 MG tablet Take 1 tablet (4 mg total) by mouth every 6 (six) hours. (Patient not taking: Reported on 10/14/2020)   No facility-administered encounter medications on file as of 10/14/2020.     REVIEW OF SYSTEMS  : All other systems reviewed and negative except where noted in the History of Present Illness.   PHYSICAL EXAM: BP 119/73   Pulse 70   Ht 6\' 1"  (1.854 m)   Wt 215 lb (97.5 kg)   SpO2 97%   BMI 28.37 kg/m  General: Well developed AA male in no acute distress Head: Normocephalic and atraumatic Eyes:  Sclerae anicteric, conjunctiva pink. Ears: Normal auditory acuity Lungs: Clear throughout to auscultation; no W/R/R. Heart: Regular rate and rhythm; no M/R/G. Abdomen: Soft, non-distended.  BS present. Non-tender. Rectal:  No external abnormalities noted.  DRE did not reveal any masses.  Small amount of light brown stool on exam glove.  Anoscopy revealed internal hemorrhoids, one which was inflamed and friable and began to bleed a little with exam. Musculoskeletal: Symmetrical with no gross deformities  Skin: No lesions on visible extremities Extremities: No edema  Neurological: Alert oriented x 4, grossly non-focal Psychological:  Alert and cooperative. Normal mood and affect  ASSESSMENT AND PLAN: *Intermittent anal/rectal discomfort and bright red blood per rectum: He does have internal  hemorrhoids on exam today, one that was friable/inflamed and began to bleed a little bit with anoscopy exam.  I will have him use Preparation H suppositories at bedtime for the next 7 to 10 days.  Prescription sent to pharmacy.  Can repeat in the future as needed.  Could consider hemorrhoid banding if they tend to bleed frequently. *Constipation: Currently only taking fiber Gummies daily.  Goal is to avoid hard stools and straining so not to further irritate the hemorrhoids.  I have asked to begin taking 1 dose of MiraLAX daily mixed in 8 ounces of liquid.  I also suggested that they switch to daily powder fiber supplement such as Benefiber, 2 teaspoons mixed in 8 ounces of liquid daily.  **We are requesting colonoscopy records from from his colonoscopy in January 2019.   CC:  No ref. provider  found

## 2022-10-29 ENCOUNTER — Encounter (HOSPITAL_COMMUNITY): Payer: Self-pay | Admitting: Emergency Medicine

## 2022-10-29 ENCOUNTER — Inpatient Hospital Stay (HOSPITAL_COMMUNITY)
Admission: EM | Admit: 2022-10-29 | Discharge: 2022-11-02 | DRG: 287 | Disposition: A | Discharge status: Home / Self Care | Payer: Medicare Other | Attending: Internal Medicine | Admitting: Internal Medicine

## 2022-10-29 ENCOUNTER — Emergency Department (HOSPITAL_COMMUNITY): Payer: Medicare Other

## 2022-10-29 DIAGNOSIS — Z79899 Other long term (current) drug therapy: Secondary | ICD-10-CM | POA: Diagnosis not present

## 2022-10-29 DIAGNOSIS — Z95810 Presence of automatic (implantable) cardiac defibrillator: Secondary | ICD-10-CM | POA: Diagnosis not present

## 2022-10-29 DIAGNOSIS — I420 Dilated cardiomyopathy: Secondary | ICD-10-CM

## 2022-10-29 DIAGNOSIS — Z7984 Long term (current) use of oral hypoglycemic drugs: Secondary | ICD-10-CM

## 2022-10-29 DIAGNOSIS — I493 Ventricular premature depolarization: Secondary | ICD-10-CM | POA: Diagnosis present

## 2022-10-29 DIAGNOSIS — I110 Hypertensive heart disease with heart failure: Secondary | ICD-10-CM | POA: Diagnosis present

## 2022-10-29 DIAGNOSIS — Z86010 Personal history of colonic polyps: Secondary | ICD-10-CM | POA: Diagnosis not present

## 2022-10-29 DIAGNOSIS — Z833 Family history of diabetes mellitus: Secondary | ICD-10-CM

## 2022-10-29 DIAGNOSIS — N400 Enlarged prostate without lower urinary tract symptoms: Secondary | ICD-10-CM | POA: Diagnosis present

## 2022-10-29 DIAGNOSIS — Z801 Family history of malignant neoplasm of trachea, bronchus and lung: Secondary | ICD-10-CM

## 2022-10-29 DIAGNOSIS — Z7982 Long term (current) use of aspirin: Secondary | ICD-10-CM

## 2022-10-29 DIAGNOSIS — E119 Type 2 diabetes mellitus without complications: Secondary | ICD-10-CM | POA: Diagnosis present

## 2022-10-29 DIAGNOSIS — I472 Ventricular tachycardia, unspecified: Principal | ICD-10-CM | POA: Diagnosis present

## 2022-10-29 DIAGNOSIS — E039 Hypothyroidism, unspecified: Secondary | ICD-10-CM | POA: Diagnosis present

## 2022-10-29 DIAGNOSIS — R519 Headache, unspecified: Secondary | ICD-10-CM | POA: Diagnosis present

## 2022-10-29 DIAGNOSIS — Z888 Allergy status to other drugs, medicaments and biological substances status: Secondary | ICD-10-CM

## 2022-10-29 DIAGNOSIS — I5022 Chronic systolic (congestive) heart failure: Secondary | ICD-10-CM | POA: Diagnosis present

## 2022-10-29 DIAGNOSIS — Z791 Long term (current) use of non-steroidal anti-inflammatories (NSAID): Secondary | ICD-10-CM | POA: Diagnosis not present

## 2022-10-29 DIAGNOSIS — I272 Pulmonary hypertension, unspecified: Secondary | ICD-10-CM | POA: Diagnosis present

## 2022-10-29 DIAGNOSIS — Z7901 Long term (current) use of anticoagulants: Secondary | ICD-10-CM

## 2022-10-29 DIAGNOSIS — I480 Paroxysmal atrial fibrillation: Secondary | ICD-10-CM | POA: Diagnosis present

## 2022-10-29 DIAGNOSIS — M1990 Unspecified osteoarthritis, unspecified site: Secondary | ICD-10-CM | POA: Diagnosis present

## 2022-10-29 LAB — COMPREHENSIVE METABOLIC PANEL
ALT: 20 U/L (ref 0–44)
AST: 30 U/L (ref 15–41)
Albumin: 3.3 g/dL — ABNORMAL LOW (ref 3.5–5.0)
Alkaline Phosphatase: 43 U/L (ref 38–126)
Anion gap: 9 (ref 5–15)
BUN: 16 mg/dL (ref 8–23)
CO2: 22 mmol/L (ref 22–32)
Calcium: 8 mg/dL — ABNORMAL LOW (ref 8.9–10.3)
Chloride: 105 mmol/L (ref 98–111)
Creatinine, Ser: 0.97 mg/dL (ref 0.61–1.24)
GFR, Estimated: >60 mL/min (ref >60)
Glucose, Bld: 116 mg/dL — ABNORMAL HIGH (ref 70–99)
Potassium: 5 mmol/L (ref 3.5–5.1)
Sodium: 136 mmol/L (ref 135–145)
Total Bilirubin: 0.4 mg/dL (ref 0.3–1.2)
Total Protein: 5.3 g/dL — ABNORMAL LOW (ref 6.5–8.1)

## 2022-10-29 LAB — CBC WITH DIFFERENTIAL/PLATELET
Abs Immature Granulocytes: 0.03 10*3/uL (ref 0.00–0.07)
Basophils Absolute: 0 10*3/uL (ref 0.0–0.1)
Basophils Relative: 0 %
Eosinophils Absolute: 0.1 10*3/uL (ref 0.0–0.5)
Eosinophils Relative: 1 %
HCT: 43.4 % (ref 39.0–52.0)
Hemoglobin: 13.8 g/dL (ref 13.0–17.0)
Immature Granulocytes: 0 %
Lymphocytes Relative: 21 %
Lymphs Abs: 1.9 10*3/uL (ref 0.7–4.0)
MCH: 25 pg — ABNORMAL LOW (ref 26.0–34.0)
MCHC: 31.8 g/dL (ref 30.0–36.0)
MCV: 78.6 fL — ABNORMAL LOW (ref 80.0–100.0)
Monocytes Absolute: 0.2 10*3/uL (ref 0.1–1.0)
Monocytes Relative: 3 %
Neutro Abs: 6.7 10*3/uL (ref 1.7–7.7)
Neutrophils Relative %: 75 %
Platelets: 124 10*3/uL — ABNORMAL LOW (ref 150–400)
RBC: 5.52 MIL/uL (ref 4.22–5.81)
RDW: 17.6 % — ABNORMAL HIGH (ref 11.5–15.5)
WBC: 9 10*3/uL (ref 4.0–10.5)
nRBC: 0 % (ref 0.0–0.2)

## 2022-10-29 LAB — GLUCOSE, CAPILLARY: Glucose-Capillary: 159 mg/dL — ABNORMAL HIGH (ref 70–99)

## 2022-10-29 LAB — MAGNESIUM: Magnesium: 2.2 mg/dL (ref 1.7–2.4)

## 2022-10-29 LAB — TROPONIN I (HIGH SENSITIVITY)
Troponin I (High Sensitivity): 12 ng/L (ref <18)
Troponin I (High Sensitivity): 12 ng/L (ref <18)

## 2022-10-29 LAB — BRAIN NATRIURETIC PEPTIDE: B Natriuretic Peptide: 480 pg/mL — ABNORMAL HIGH (ref 0.0–100.0)

## 2022-10-29 MED ORDER — AMIODARONE HCL IN DEXTROSE 360-4.14 MG/200ML-% IV SOLN
30.0000 mg/h | INTRAVENOUS | Status: AC
  Administered 2022-10-30 – 2022-11-01 (×5): 30 mg/h via INTRAVENOUS
  Filled 2022-10-29 (×6): qty 200

## 2022-10-29 MED ORDER — INSULIN ASPART 100 UNIT/ML IJ SOLN
0.0000 [IU] | Freq: Three times a day (TID) | INTRAMUSCULAR | Status: DC
  Administered 2022-11-01: 3 [IU] via SUBCUTANEOUS
  Administered 2022-11-01: 2 [IU] via SUBCUTANEOUS

## 2022-10-29 MED ORDER — INSULIN ASPART 100 UNIT/ML IJ SOLN: 0.0000 [IU] | Freq: Every day | INTRAMUSCULAR | Status: DC

## 2022-10-29 MED ORDER — CARVEDILOL 12.5 MG PO TABS
12.5000 mg | ORAL_TABLET | Freq: Two times a day (BID) | ORAL | Status: DC
  Administered 2022-10-30 – 2022-11-02 (×7): 12.5 mg via ORAL
  Filled 2022-10-29 (×7): qty 1

## 2022-10-29 MED ORDER — TAMSULOSIN HCL 0.4 MG PO CAPS
0.4000 mg | ORAL_CAPSULE | Freq: Every day | ORAL | Status: DC
  Administered 2022-10-29 – 2022-11-01 (×4): 0.4 mg via ORAL
  Filled 2022-10-29 (×4): qty 1

## 2022-10-29 MED ORDER — VITAMIN D 25 MCG (1000 UNIT) PO TABS
2000.0000 [IU] | ORAL_TABLET | Freq: Every day | ORAL | Status: DC
  Administered 2022-10-30 – 2022-11-02 (×4): 2000 [IU] via ORAL
  Filled 2022-10-29 (×4): qty 2

## 2022-10-29 MED ORDER — SACUBITRIL-VALSARTAN 49-51 MG PO TABS
1.0000 | ORAL_TABLET | Freq: Two times a day (BID) | ORAL | Status: DC
  Administered 2022-10-29 – 2022-11-02 (×8): 1 via ORAL
  Filled 2022-10-29 (×8): qty 1

## 2022-10-29 MED ORDER — FINASTERIDE 5 MG PO TABS
5.0000 mg | ORAL_TABLET | Freq: Every day | ORAL | Status: DC
  Administered 2022-10-29 – 2022-11-02 (×5): 5 mg via ORAL
  Filled 2022-10-29 (×5): qty 1

## 2022-10-29 MED ORDER — LEVOTHYROXINE SODIUM 100 MCG PO TABS
100.0000 ug | ORAL_TABLET | Freq: Every day | ORAL | Status: DC
  Administered 2022-10-30 – 2022-11-02 (×3): 100 ug via ORAL
  Filled 2022-10-29 (×3): qty 1

## 2022-10-29 MED ORDER — AMIODARONE HCL IN DEXTROSE 360-4.14 MG/200ML-% IV SOLN
60.0000 mg/h | INTRAVENOUS | Status: AC
  Administered 2022-10-29 (×2): 60 mg/h via INTRAVENOUS
  Filled 2022-10-29: qty 200

## 2022-10-29 MED ORDER — HEPARIN (PORCINE) 25000 UT/250ML-% IV SOLN
1400.0000 [IU]/h | INTRAVENOUS | Status: DC
  Administered 2022-10-29 – 2022-10-31 (×3): 1250 [IU]/h via INTRAVENOUS
  Filled 2022-10-29 (×3): qty 250

## 2022-10-29 MED ORDER — SACCHAROMYCES BOULARDII 250 MG PO CAPS
ORAL_CAPSULE | Freq: Every day | ORAL | Status: DC
  Administered 2022-10-30 – 2022-11-02 (×4): 250 mg via ORAL
  Filled 2022-10-29 (×4): qty 1

## 2022-10-29 MED ORDER — AMIODARONE LOAD VIA INFUSION
150.0000 mg | Freq: Once | INTRAVENOUS | Status: AC
  Administered 2022-10-29: 150 mg via INTRAVENOUS
  Filled 2022-10-29: qty 83.34

## 2022-10-29 MED ORDER — VITAMIN C 500 MG PO TABS
500.0000 mg | ORAL_TABLET | Freq: Every day | ORAL | Status: DC
  Administered 2022-10-30 – 2022-11-02 (×4): 500 mg via ORAL
  Filled 2022-10-29 (×4): qty 1

## 2022-10-29 MED ORDER — ASPIRIN 81 MG PO TBEC
81.0000 mg | DELAYED_RELEASE_TABLET | Freq: Every day | ORAL | Status: DC
  Administered 2022-10-30 – 2022-10-31 (×2): 81 mg via ORAL
  Filled 2022-10-29 (×2): qty 1

## 2022-10-29 MED ORDER — POLYVINYL ALCOHOL 1.4 % OP SOLN: 1.0000 [drp] | OPHTHALMIC | Status: DC | PRN

## 2022-10-29 MED ORDER — MECLIZINE HCL 25 MG PO TABS: 25.0000 mg | ORAL_TABLET | Freq: Three times a day (TID) | ORAL | Status: DC | PRN

## 2022-10-29 MED ORDER — ADULT MULTIVITAMIN W/MINERALS CH
1.0000 | ORAL_TABLET | Freq: Every day | ORAL | Status: DC
  Administered 2022-10-30 – 2022-11-02 (×4): 1 via ORAL
  Filled 2022-10-29 (×4): qty 1

## 2022-10-29 MED ORDER — HYDROCORTISONE ACETATE 25 MG RE SUPP
25.0000 mg | Freq: Every day | RECTAL | Status: DC
  Filled 2022-10-29 (×2): qty 1

## 2022-10-29 MED ORDER — METFORMIN HCL 500 MG PO TABS
500.0000 mg | ORAL_TABLET | Freq: Two times a day (BID) | ORAL | Status: DC
  Administered 2022-10-30: 500 mg via ORAL
  Filled 2022-10-29: qty 1

## 2022-10-29 MED ORDER — APIXABAN 5 MG PO TABS: 5.0000 mg | ORAL_TABLET | Freq: Two times a day (BID) | ORAL | Status: DC

## 2022-10-29 MED ORDER — POLYETHYLENE GLYCOL 3350 17 G PO PACK
17.0000 g | PACK | Freq: Every day | ORAL | Status: DC
  Administered 2022-10-31 – 2022-11-02 (×3): 17 g via ORAL
  Filled 2022-10-29 (×4): qty 1

## 2022-10-29 MED ORDER — MELOXICAM 7.5 MG PO TABS: 15.0000 mg | ORAL_TABLET | ORAL | Status: DC | PRN

## 2022-10-29 NOTE — H&P (Addendum)
ELECTROPHYSIOLOGY H&P NOTE    Patient ID: Chad Gibson MRN: [id number], DOB/AGE: 1941/03/03 82 y.o.  Admit date: 10/29/2022 Date of Consult: 10/29/2022  Primary Physician: System, Provider Not In Primary Cardiologist: None  Electrophysiologist: New to [facility] (Followed by [place])   Reason for admission: VT storm  Patient Profile: Chad Gibson is a 82 y.o. male with a history of chronic systolic CHF and s/p MDT ICD for secondary prevention, HTN, Hypothyroidism, Thyroid disease, Arthritis, PVCs, and BPH who is being seen today for the evaluation of tachycardia at the request of Dr. Vanita Panda.  HPI:  Pts wife was checking his vitals this am and noted he was tachycardia into the 130s. Continued through lunch time and they sent a transmission to their device clinic. [facility] to state he was having VT by his device, both treated by ATP, and under detection, and he was recommended to proceed to the ED.   Pt does not think he has ever been shocked by his device. But by records and his remembrance, at least one defibrillation before his ICD was implanted.   Pt denies syncope, stent, or "heart attack". Has occasional dizziness where he has to "hold on" to something. Denies prior ICD shocks;   Denies recent illnesses or missing any medications.    Labs Potassium5.0 (03/15 1550) Magnesium  2.2 (03/15 1550) Creatinine, ser  0.97 (03/15 1550) PLT  124* (03/15 1550) HGB  13.8 (03/15 1550) WBC 9.0 (03/15 1550)  .    Past Medical History:  Diagnosis Date   Arthritis    Arthritis    BPH (benign prostatic hyperplasia)    CHF (congestive heart failure) (HCC)    Colon polyp    Diabetes mellitus without complication ([place])    Eczema    History of colon polyps    Hypertension    Hypertension    Hypothyroidism    Sesamoiditis    Sleep apnea    Thyroid disease    Warts    chronic     Surgical History:  Past Surgical History:  Procedure Laterality Date   BUNIONECTOMY      CHOLECYSTECTOMY     COLONOSCOPY  2019   HEMORRHOID SURGERY     HERNIA REPAIR     KNEE REPAIR EXTENSOR MECHANISM     PACEMAKER IMPLANT  04/16/2020   Medtronic      (Not in a hospital admission)   Inpatient Medications:   Allergies:  Allergies  Allergen Reactions   Ace Inhibitors     Family History  Problem Relation Age of Onset   Lung cancer Sister    Diabetes Brother      Physical Exam: HR 79 bpm BP 105/67 SPO2 100  GEN- NAD, A&O x 3, normal affect HEENT: Normocephalic, atraumatic Lungs- CTAB, Normal effort.  Heart- Regular rate and rhythm, No M/G/R.  GI- Soft, NT, ND.  Extremities- No clubbing, cyanosis, or edema   Radiology/Studies: DG Chest Port 1 View  Result Date: 10/29/2022 CLINICAL DATA:  Tachycardia. EXAM: PORTABLE CHEST 1 VIEW COMPARISON:  None Available. FINDINGS: 1602 hours. Left chest AICD/pacemaker with leads projecting over the right atrium and ventricle. No focal airspace opacity. Normal heart size and mediastinal contours. No pleural effusion or pneumothorax. Visualized bones and upper abdomen are unremarkable. IMPRESSION: No evidence of acute cardiopulmonary disease. Electronically Signed   By: Emmit Alexanders M.D.   On: 10/29/2022 16:38    EKG:on arrival shows A pacing and bigeminal PVCs ~ 90 bpm (personally  reviewed)  TELEMETRY: NSR with frequent PVCs including bigeminy, NSVT noted as well ~ 130 bpm  (personally reviewed)  DEVICE HISTORY: MDT [place] implanted 04/2020 for secondary prevention, at the [place]  Assessment/Plan: VT storm Frequent PVCs With a relatively slow cycle length, 130-140 bpm With > 40 ATP today, > 100 episodes of VT since 3/7. Typically, the VT terminates with one spin of ATP. Potassium5.0 (03/15 1550) Magnesium  2.2 (03/15 1550) Creatinine, ser  0.97 (03/15 1550) Keep K > 4.0 and Mg > 2.0  Once resulted, need to keep K > 4.0 and Mg > 2.0  Will load with IV amiodarone.  If pt has recurrent, sustained VT or shock overnight,  would rebolus, up to 8 times per Dr. Caryl Comes.  Device IS MRI compatible  We were unable to overdrive pace up to 90 bpm We do not feel his PVC and VT EGMs match, so it less likely a PVC ablation would also treat the current episodes of VT he is having.  We reviewed [place] driving restrictions. No driving x 6 months.   Chronic systolic CHF Non-ischemic per pt.  Cycle troponins Update Echo EF 40-45% 06/2022 at [facility];  Given the burden of his VT, would plan L/RHC early next week.   HTN Follow  Paroxysmal atrial fibrillation CHA2DS2/VASc is at least 4 (age, HTN, CHF) Continue eliquis 5 mg BID.   Will plan to admit pt for amiodarone load due to VT storm  For questions or updates, please contact [place] Please consult [email or web address] for contact info under Cardiology/STEMI.  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  There was a flurry of nonsustained ventricular tachycardia preceding this and another flurry of ventricular tachycardia in early January temporally related at that time to cold.  No acute changes of symptoms at this point to give [place] a strong clue as to trigger of his ventricular tachycardia although his BNP is mildly elevated.  He is not volume overloaded on exam.  We will diurese him gently.  Given the flurry of ventricular tachycardia, we will start him on intravenous amiodarone with a plan to rebolus him for recurrent episodes of VT.  We have reprogrammed his defibrillator to lower detection rate from 140--125, we have changes NID to 100 beats, and reprogrammed his antitachycardia pacing  to end of 8, with 12 pulses starting at about 80% which will take [place] down to [place] maximum cycle length of about 250 ms.  As noted above, the PVC morphology is distinct from the ventricular tachycardia morphology.  His PVC burden is extremely high often bigeminal and has been for some time.  We tried overdrive suppression today at rates of up to 100 without success.  Will use amiodarone for this as well.  Amiodarone had been discussed and dismissed at the [place] because of concerns of long-term side effects, in this context of his multiple episodes of ventricular tachycardia we are altering that course.  In terms of other potential mechanisms, there could be interval deterioration of LV function related to his PVC burden.  Will get an echocardiogram and then would anticipate in the catheterization also to look for potential triggers.  The initiating beat of his VT is  a little bit distinct from his VT morphology, moreover, the termination with antitachycardia pacing suggests a reentrant mechanism albeit possibly even micro and reentrant based on the VT more apology changes.  Will plan to hold his Eliquis in anticipation  He has been advised that he not drive for up to 6 months.

## 2022-10-29 NOTE — ED Triage Notes (Signed)
Pt here from home with c/o ST since around lunch , [facility] called [facility] and said pt was in v tach and needed to go to the hospital , pt has no complaints , converted to a rate 82 on  arrival

## 2022-10-29 NOTE — ED Provider Notes (Signed)
[facility] Provider Note   CSN: [id number] Arrival date & time: 10/29/22  1535     History  No chief complaint on file.   Chad Gibson is a 82 y.o. male.  HPI Adult male arrives via EMS, coming by family with concern for tacky arrhythmia. Patient notes that he is essentially asymptomatic, has been.  However, this is not unusual for the patient when he is found to have tacky arrhythmia. History is notable for pacer defibrillator, placed at another facility.  Cardiology care is provided by the [facility].  The patient's wife checked his vitals today found to be tachycardic, and after speaking with the device company, with remote interrogation concerning for V. tach he was encouraged to come to the hospital.  EMS reports the patient had sustained tachycardia, 130s en route.  He received amiodarone without appreciable change in his condition.  Patient denies any recent overt illness such as shortness of breath, fever.       Home Medications Prior to Admission medications   Medication Sig Start Date End Date Taking? Authorizing Provider  aspirin EC 81 MG tablet Take 81 mg by mouth daily.    [provider]  carvedilol (COREG) 12.5 MG tablet Take 12.5 mg by mouth 2 (two) times daily with a meal.    [provider]  Cholecalciferol (VITAMIN D) 50 MCG (2000 UT) tablet Take 2,000 Units by mouth daily. 08/14/18   [provider]  finasteride (PROSCAR) 5 MG tablet Take 5 mg by mouth daily.    [provider]  glucose blood test strip 1 each by Other route as needed for other. Use as instructed    [provider]  Glycerin-Hypromellose-PEG 400 0.2-0.2-1 % SOLN Place 1 drop into both eyes as needed (Dry eye). refresh - as needed    [provider]  hydrocortisone (ANUSOL-HC) 25 MG suppository Place 1 suppository (25 mg total) rectally at bedtime. 10/14/20   Zehr, Laban Emperor, PA-C  lactobacillus acidophilus &  bulgar (LACTINEX) chewable tablet Chew 1 tablet by mouth daily.    [provider]  Levothyroxine Sodium 100 MCG CAPS Take 100 mcg by mouth daily before breakfast.     [provider]  meclizine (ANTIVERT) 25 MG tablet Take 1 tablet (25 mg total) by mouth 3 (three) times daily as needed for dizziness. 09/12/18   Lawyer, Harrell Gave, PA-C  meloxicam (MOBIC) 15 MG tablet Take 1 tablet (15 mg total) by mouth daily. Patient taking differently: Take 15 mg by mouth as needed for pain. 06/16/18   Gardiner Barefoot, DPM  metFORMIN (GLUCOPHAGE) 500 MG tablet Take 500 mg by mouth 2 (two) times daily with a meal.     [provider]  Multiple Vitamin (MULTIVITAMIN WITH MINERALS) TABS tablet Take 1 tablet by mouth daily. Centrum Silver    [provider]  polyethylene glycol (MIRALAX / GLYCOLAX) 17 g packet Take 17 g by mouth daily. PRN    [provider]  sacubitril-valsartan (ENTRESTO) 49-51 MG Take 1 tablet by mouth 2 (two) times daily.    [provider]  tamsulosin (FLOMAX) 0.4 MG CAPS capsule Take 0.4 mg by mouth at bedtime.     [provider]  vitamin C (ASCORBIC ACID) 500 MG tablet Take 500 mg by mouth daily.    [provider]      Allergies    Ace inhibitors    Review of Systems   Review of Systems  All  other systems reviewed and are negative.   Physical Exam Updated Vital Signs BP 127/70 (BP Location: Left Arm)   Pulse 72   Temp 98.1 F (36.7 C) (Oral)   Resp 15   Ht 6\' 1"  (1.854 m)   Wt 90.9 kg   SpO2 98%   BMI 26.44 kg/m  Physical Exam Vitals and nursing note reviewed.  Constitutional:      General: He is not in acute distress.    Appearance: He is well-developed.  HENT:     Head: Normocephalic and atraumatic.  Eyes:     Conjunctiva/sclera: Conjunctivae normal.  Cardiovascular:     Rate and Rhythm: Normal rate and regular rhythm.     Pulses: Normal pulses.  Pulmonary:     Effort: Pulmonary effort is  normal. No respiratory distress.     Breath sounds: No stridor.  Abdominal:     General: There is no distension.  Skin:    General: Skin is warm and dry.  Neurological:     Mental Status: He is alert and oriented to person, place, and time.     ED Results / Procedures / Treatments   Labs (all labs ordered are listed, but only abnormal results are displayed) Labs Reviewed  COMPREHENSIVE METABOLIC PANEL - Abnormal; Notable for the following components:      Result Value   Glucose, Bld 116 (*)    Calcium 8.0 (*)    Total Protein 5.3 (*)    Albumin 3.3 (*)    All other components within normal limits  CBC WITH DIFFERENTIAL/PLATELET - Abnormal; Notable for the following components:   MCV 78.6 (*)    MCH 25.0 (*)    RDW 17.6 (*)    Platelets 124 (*)    All other components within normal limits  BRAIN NATRIURETIC PEPTIDE - Abnormal; Notable for the following components:   B Natriuretic Peptide 480.0 (*)    All other components within normal limits  GLUCOSE, CAPILLARY - Abnormal; Notable for the following components:   Glucose-Capillary 159 (*)    All other components within normal limits  MAGNESIUM  TSH  CBC  BASIC METABOLIC PANEL  MAGNESIUM  APTT  HEMOGLOBIN A1C  TROPONIN I (HIGH SENSITIVITY)  TROPONIN I (HIGH SENSITIVITY)    EKG EKG Interpretation  Date/Time:  Friday October 29 2022 15:47:01 EDT Ventricular Rate:  93 PR Interval:  264 QRS Duration: 125 QT Interval:  378 QTC Calculation: 375 R Axis:   -58 Text Interpretation: ATRIAL PACED RHYTHM Premature atrial complexes Premature ventricular complexes Abnormal ECG Confirmed by Carmin Muskrat [phone]) on 10/29/2022 3:52:54 PM  Radiology DG Chest Port 1 View  Result Date: 10/29/2022 CLINICAL DATA:  Tachycardia. EXAM: PORTABLE CHEST 1 VIEW COMPARISON:  None Available. FINDINGS: 1602 hours. Left chest AICD/pacemaker with leads projecting over the right atrium and ventricle. No focal airspace opacity. Normal heart size  and mediastinal contours. No pleural effusion or pneumothorax. Visualized bones and upper abdomen are unremarkable. IMPRESSION: No evidence of acute cardiopulmonary disease. Electronically Signed   By: Emmit Alexanders M.D.   On: 10/29/2022 16:38    Procedures Procedures    Medications Ordered in ED Medications  amiodarone (NEXTERONE) 1.8 mg/mL load via infusion 150 mg (150 mg Intravenous Bolus from Bag 10/29/22 1735)    Followed by  amiodarone (NEXTERONE PREMIX) 360-4.14 MG/200ML-% (1.8 mg/mL) IV infusion (0 mg/hr Intravenous Stopped 10/29/22 2302)    Followed by  amiodarone (NEXTERONE PREMIX) 360-4.14 MG/200ML-% (1.8 mg/mL) IV infusion (30 mg/hr Intravenous  Rate/Dose Change 10/29/22 2301)  aspirin EC tablet 81 mg (has no administration in time range)  meloxicam (MOBIC) tablet 15 mg (has no administration in time range)  carvedilol (COREG) tablet 12.5 mg (has no administration in time range)  sacubitril-valsartan (ENTRESTO) 49-51 mg per tablet (1 tablet Oral Given 10/29/22 2255)  levothyroxine (SYNTHROID) tablet 100 mcg (has no administration in time range)  metFORMIN (GLUCOPHAGE) tablet 500 mg (has no administration in time range)  saccharomyces boulardii (FLORASTOR) capsule ( Oral Not Given 10/29/22 2305)  meclizine (ANTIVERT) tablet 25 mg (has no administration in time range)  polyethylene glycol (MIRALAX / GLYCOLAX) packet 17 g (17 g Oral Not Given 10/29/22 2304)  finasteride (PROSCAR) tablet 5 mg (5 mg Oral Given 10/29/22 2255)  tamsulosin (FLOMAX) capsule 0.4 mg (0.4 mg Oral Given 10/29/22 2255)  cholecalciferol (VITAMIN D3) 25 MCG (1000 UNIT) tablet 2,000 Units (2,000 Units Oral Not Given 10/29/22 2303)  multivitamin with minerals tablet 1 tablet (1 tablet Oral Not Given 10/29/22 2304)  ascorbic acid (VITAMIN C) tablet 500 mg (500 mg Oral Not Given 10/29/22 2303)  polyvinyl alcohol (LIQUIFILM TEARS) 1.4 % ophthalmic solution 1 drop (has no administration in time range)  hydrocortisone  (ANUSOL-HC) suppository 25 mg (25 mg Rectal Not Given 10/29/22 2303)  insulin aspart (novoLOG) injection 0-15 Units (has no administration in time range)  insulin aspart (novoLOG) injection 0-5 Units ( Subcutaneous Not Given 10/29/22 2219)  heparin ADULT infusion 100 units/mL (25000 units/263mL) (1,250 Units/hr Intravenous Infusion Verify 10/29/22 2301)    ED Course/ Medical Decision Making/ A&P                             Medical Decision Making Adult male with history of SVT, A-fib, pacer defibrillator in place presents with concern for tacky arrhythmia.  Rhythm strips concerning for wide-complex tachycardia, though the patient has had periods of sustained narrow complex rhythm as well.  Consideration of pacemaker dysfunction versus limit abnormalities versus electrolyte abnormalities versus other etiology.  Patient placed on continuous monitoring, pulse oximetry.  I discussed his case with cardiology earlier in his evaluation. Cardiac 85 with ectopy abnormal Pulse ox 99% room air normal   Amount and/or Complexity of Data Reviewed Independent Historian: spouse and EMS External Data Reviewed:     Details: EMS rhythm strip reviewed, results above Labs: ordered. Decision-making details documented in ED Course. Radiology: ordered and independent interpretation performed. Decision-making details documented in ED Course. ECG/medicine tests: ordered and independent interpretation performed. Decision-making details documented in ED Course.  Risk Decision regarding hospitalization.   Update: Patient in similar condition.   Update: I discussed the patient's presentation with our cardiology colleagues at bedside.  We interpreted the patient's ICD device for now, concern for multiple episodes of ventricular tachycardia.  Labs generally noncontributory aside from mild BNP elevation, x-ray is noncontributory.  Given concern for ongoing tachyarrhythmia, need for additional electrophysiology  evaluation, monitoring, management, again discussed case with another cardiology colleague and the patient was admitted for further monitoring and management.        Final Clinical Impression(s) / ED Diagnoses Final diagnoses:  VT (ventricular tachycardia) (HCC)     Carmin Muskrat, MD 10/29/22 2353

## 2022-10-29 NOTE — Progress Notes (Addendum)
ANTICOAGULATION CONSULT NOTE - Initial Consult  Pharmacy Consult for heparin  Indication: atrial fibrillation  Allergies  Allergen Reactions   Ace Inhibitors     Patient Measurements:   Heparin Dosing Weight: 90.9kg   Vital Signs: BP: 120/63 (03/15 1906) Pulse Rate: 38 (03/15 1906)  Labs: Recent Labs    10/29/22 1550 10/29/22 1654  HGB 13.8  --   HCT 43.4  --   PLT 124*  --   CREATININE 0.97  --   TROPONINIHS  --  12    CrCl cannot be calculated (Unknown ideal weight.).   Medical History: Past Medical History:  Diagnosis Date   Arthritis    Arthritis    BPH (benign prostatic hyperplasia)    CHF (congestive heart failure) (HCC)    Colon polyp    Diabetes mellitus without complication (HCC)    Eczema    History of colon polyps    Hypertension    Hypertension    Hypothyroidism    Sesamoiditis    Sleep apnea    Thyroid disease    Warts    chronic     Assessment: Patient here with CC of Vtach on Eliquis PTA for Afib. Last dose 3/15 @ 08:00 per patient. CBC stable. Pharmacy consulted to dose heparin while Eliquis is on hold for potential cath.  Goal of Therapy:   Heparin level 0.3-0.7 units/ml Monitor platelets by anticoagulation protocol: Yes   Plan:  No bolus given recent DOAC intake.  Start heparin infusion at 1250 units/hr Check apTT level in 8 hours and daily while on heparin. Consider obtaining heparin level on 3/16 to assess correlation.  Continue to monitor H&H and platelets  Esmeralda Arthur, PharmD, BCCCP  10/29/2022,7:12 PM

## 2022-10-29 NOTE — ED Notes (Signed)
ED TO INPATIENT HANDOFF REPORT  ED Nurse Name and Phone #: [place]  [phone]   S Name/Age/Gender Chad Gibson 82 y.o. male Room/Bed: 017C/017C  Code Status   Code Status: Full Code  Home/SNF/Other Home Patient oriented to: self, place, time, and situation Is this baseline? Yes   Triage Complete: Triage complete  Chief Complaint VT (ventricular tachycardia) ([place]) [I47.20]  Triage Note Pt here from home with c/o ST since around lunch , [facility] and needed to [facility] to the hospital , pt has no complaints , converted to a rate 82 on  arrival    Allergies Allergies  Allergen Reactions   Ace Inhibitors     Level of Care/Admitting Diagnosis ED Disposition     ED Disposition  [place]: [place] [[id number]]  Level of Care: Progressive [102]  Admit to Progressive based on following criteria: CARDIOVASCULAR & THORACIC of moderate stability with acute coronary syndrome symptoms/low risk myocardial infarction/hypertensive urgency/arrhythmias/heart failure potentially compromising stability and stable post cardiovascular intervention patients.  May admit patient to [facility] or [facility] if equivalent level of care is available:: No  Covid Evaluation: Asymptomatic - no recent exposure (last 10 days) testing not required  Diagnosis: VT (ventricular tachycardia) [facility]) [id number]  Admitting Physician: Gardiner Barefoot  Attending Physician: Deboraha Sprang [id number]  Certification:: I certify this patient will need inpatient services for at least 2 midnights  Estimated Length of Stay: 3          B Medical/Surgery History Past Medical History:  Diagnosis Date   Arthritis    Arthritis    BPH (benign prostatic hyperplasia)    CHF (congestive heart failure) ([place])    Colon polyp    Diabetes mellitus without complication ([place])    Eczema    History of colon polyps    Hypertension     Hypertension    Hypothyroidism    Sesamoiditis    Sleep apnea    Thyroid disease    Warts    chronic   Past Surgical History:  Procedure Laterality Date   BUNIONECTOMY     CHOLECYSTECTOMY     COLONOSCOPY  2019   HEMORRHOID SURGERY     HERNIA REPAIR     KNEE REPAIR EXTENSOR MECHANISM     PACEMAKER IMPLANT  04/16/2020   Medtronic      A IV Location/Drains/Wounds Patient Lines/Drains/Airways Status     Active Line/Drains/Airways     Name Placement date Placement time Site Days   Peripheral IV 10/29/22 18 G Anterior;Distal;Left;Upper Arm 10/29/22  1556  Arm  less than 1   Peripheral IV 10/29/22 18 G Right Antecubital 10/29/22  1556  Antecubital  less than 1            Intake/Output Last 24 hours No intake or output data in the 24 hours ending 10/29/22 1844  Labs/Imaging Results for orders placed or performed during the hospital encounter of 10/29/22 (from the past 48 hour(s))  Comprehensive metabolic panel     Status: Abnormal   Collection Time: 10/29/22  3:50 PM  Result Value Ref Range   Sodium 136 135 - 145 mmol/L   Potassium 5.0 3.5 - 5.1 mmol/L    Comment: HEMOLYSIS AT THIS LEVEL MAY AFFECT RESULT   Chloride 105 98 - 111 mmol/L   CO2 22 22 - 32 mmol/L   Glucose, Bld  116 (H) 70 - 99 mg/dL    Comment: Glucose reference range applies only to samples taken after fasting for at least 8 hours.   BUN 16 8 - 23 mg/dL   Creatinine, Ser 0.97 0.61 - 1.24 mg/dL   Calcium 8.0 (L) 8.9 - 10.3 mg/dL   Total Protein 5.3 (L) 6.5 - 8.1 g/dL   Albumin 3.3 (L) 3.5 - 5.0 g/dL   AST 30 15 - 41 U/L    Comment: HEMOLYSIS AT THIS LEVEL MAY AFFECT RESULT   ALT 20 0 - 44 U/L    Comment: HEMOLYSIS AT THIS LEVEL MAY AFFECT RESULT   Alkaline Phosphatase 43 38 - 126 U/L   Total Bilirubin 0.4 0.3 - 1.2 mg/dL    Comment: HEMOLYSIS AT THIS LEVEL MAY AFFECT RESULT   GFR, Estimated >60 >60 mL/min    Comment: (NOTE) Calculated using the CKD-EPI Creatinine Equation (2021)    Anion gap 9 5  - 15    Comment: Performed at [facility], [place] [address], [place], [place] [address]  CBC with Differential     Status: Abnormal   Collection Time: 10/29/22  3:50 PM  Result Value Ref Range   WBC 9.0 4.0 - 10.5 K/uL   RBC 5.52 4.22 - 5.81 MIL/uL   Hemoglobin 13.8 13.0 - 17.0 g/dL   HCT 43.4 39.0 - 52.0 %   MCV 78.6 (L) 80.0 - 100.0 fL   MCH 25.0 (L) 26.0 - 34.0 pg   MCHC 31.8 30.0 - 36.0 g/dL   RDW 17.6 (H) 11.5 - 15.5 %   Platelets 124 (L) 150 - 400 K/uL    Comment: REPEATED TO VERIFY   nRBC 0.0 0.0 - 0.2 %   Neutrophils Relative % 75 %   Neutro Abs 6.7 1.7 - 7.7 K/uL   Lymphocytes Relative 21 %   Lymphs Abs 1.9 0.7 - 4.0 K/uL   Monocytes Relative 3 %   Monocytes Absolute 0.2 0.1 - 1.0 K/uL   Eosinophils Relative 1 %   Eosinophils Absolute 0.1 0.0 - 0.5 K/uL   Basophils Relative 0 %   Basophils Absolute 0.0 0.0 - 0.1 K/uL   Immature Granulocytes 0 %   Abs Immature Granulocytes 0.03 0.00 - 0.07 K/uL    Comment: Performed at [facility], [place] [address], [place], [place] [address]  Brain natriuretic peptide     Status: Abnormal   Collection Time: 10/29/22  3:50 PM  Result Value Ref Range   B Natriuretic Peptide 480.0 (H) 0.0 - 100.0 pg/mL    Comment: Performed at [facility] [address], [place], [place] [address]  Magnesium     Status: None   Collection Time: 10/29/22  3:50 PM  Result Value Ref Range   Magnesium 2.2 1.7 - 2.4 mg/dL    Comment: Performed at [facility], [place] [address], [place], [place] [address]   DG Chest Port 1 View  Result Date: 10/29/2022 CLINICAL DATA:  Tachycardia. EXAM: PORTABLE CHEST 1 VIEW COMPARISON:  None Available. FINDINGS: 1602 hours. Left chest AICD/pacemaker with leads projecting over the right atrium and ventricle. No focal airspace opacity. Normal heart size and mediastinal contours. No pleural effusion or pneumothorax. Visualized bones and upper abdomen are unremarkable. IMPRESSION: No evidence of acute  cardiopulmonary disease. Electronically Signed   By: Emmit Alexanders M.D.   On: 10/29/2022 16:38    Pending Labs Unresulted Labs (From admission, onward)  Start     Ordered   10/30/22 0500  TSH  Tomorrow morning,   R        10/29/22 1714   Signed and Held  Hemoglobin A1c  Once,   R       Comments: To assess prior glycemic control    Signed and Held   Signed and Held  Basic metabolic panel  Daily,   R      Signed and Held   Signed and Held  Magnesium  Daily,   R      Signed and Held            Vitals/Pain Today's Vitals   10/29/22 1730 10/29/22 1745 10/29/22 1800 10/29/22 1815  BP: 129/68 118/67 116/61 115/61  Pulse: (!) 39 (!) 38 (!) 38 (!) 37  Resp: 17 16 14  (!) 0  SpO2: 100% 100% 100% 100%  PainSc:        Isolation Precautions No active isolations  Medications Medications  amiodarone (NEXTERONE) 1.8 mg/mL load via infusion 150 mg (150 mg Intravenous Bolus from Bag 10/29/22 1735)    Followed by  amiodarone (NEXTERONE PREMIX) 360-4.14 MG/200ML-% (1.8 mg/mL) IV infusion (60 mg/hr Intravenous New Bag/Given 10/29/22 1746)    Followed by  amiodarone (NEXTERONE PREMIX) 360-4.14 MG/200ML-% (1.8 mg/mL) IV infusion (has no administration in time range)  apixaban (ELIQUIS) tablet 5 mg (has no administration in time range)    Mobility walks     Focused Assessments Cardiac Assessment Handoff:    No results found for: "CKTOTAL", "CKMB", "CKMBINDEX", "TROPONINI" No results found for: "DDIMER" Does the Patient currently have chest pain? No    R Recommendations: See Admitting Provider Note  Report given to:   Additional Notes: om amio gtt

## 2022-10-30 ENCOUNTER — Inpatient Hospital Stay (HOSPITAL_COMMUNITY): Payer: Medicare Other

## 2022-10-30 ENCOUNTER — Other Ambulatory Visit: Payer: Self-pay

## 2022-10-30 DIAGNOSIS — I5022 Chronic systolic (congestive) heart failure: Secondary | ICD-10-CM | POA: Diagnosis not present

## 2022-10-30 DIAGNOSIS — I472 Ventricular tachycardia, unspecified: Secondary | ICD-10-CM | POA: Diagnosis not present

## 2022-10-30 LAB — CBC
HCT: 41.6 % (ref 39.0–52.0)
Hemoglobin: 14.3 g/dL (ref 13.0–17.0)
MCH: 25.9 pg — ABNORMAL LOW (ref 26.0–34.0)
MCHC: 34.4 g/dL (ref 30.0–36.0)
MCV: 75.4 fL — ABNORMAL LOW (ref 80.0–100.0)
Platelets: 119 10*3/uL — ABNORMAL LOW (ref 150–400)
RBC: 5.52 MIL/uL (ref 4.22–5.81)
RDW: 17.1 % — ABNORMAL HIGH (ref 11.5–15.5)
WBC: 8 10*3/uL (ref 4.0–10.5)
nRBC: 0 % (ref 0.0–0.2)

## 2022-10-30 LAB — BASIC METABOLIC PANEL
Anion gap: 10 (ref 5–15)
BUN: 14 mg/dL (ref 8–23)
CO2: 22 mmol/L (ref 22–32)
Calcium: 8.8 mg/dL — ABNORMAL LOW (ref 8.9–10.3)
Chloride: 103 mmol/L (ref 98–111)
Creatinine, Ser: 1.04 mg/dL (ref 0.61–1.24)
GFR, Estimated: >60 mL/min (ref >60)
Glucose, Bld: 111 mg/dL — ABNORMAL HIGH (ref 70–99)
Potassium: 4.2 mmol/L (ref 3.5–5.1)
Sodium: 135 mmol/L (ref 135–145)

## 2022-10-30 LAB — GLUCOSE, CAPILLARY
Glucose-Capillary: 116 mg/dL — ABNORMAL HIGH (ref 70–99)
Glucose-Capillary: 120 mg/dL — ABNORMAL HIGH (ref 70–99)
Glucose-Capillary: 107 mg/dL — ABNORMAL HIGH (ref 70–99)
Glucose-Capillary: 127 mg/dL — ABNORMAL HIGH (ref 70–99)

## 2022-10-30 LAB — ECHOCARDIOGRAM COMPLETE
AR max vel: 3.09 cm2
AV Area VTI: 2.41 cm2
AV Area mean vel: 2.52 cm2
AV Mean grad: 3 mmHg
AV Peak grad: 4.2 mmHg
Ao pk vel: 1.03 m/s
Area-P 1/2: 6.17 cm2
Height: 73 in
MV M vel: 4.32 m/s
MV Peak grad: 74.6 mmHg
S' Lateral: 4.5 cm
Single Plane A4C EF: 32.6 %
Weight: 3206.37 oz

## 2022-10-30 LAB — APTT
aPTT: 91 seconds — ABNORMAL HIGH (ref 24–36)
aPTT: 83 seconds — ABNORMAL HIGH (ref 24–36)

## 2022-10-30 LAB — MAGNESIUM: Magnesium: 2.4 mg/dL (ref 1.7–2.4)

## 2022-10-30 LAB — TSH: TSH: 3.107 u[IU]/mL (ref 0.350–4.500)

## 2022-10-30 MED ORDER — ACETAMINOPHEN 325 MG PO TABS
650.0000 mg | ORAL_TABLET | Freq: Four times a day (QID) | ORAL | Status: DC | PRN
  Administered 2022-10-30 – 2022-10-31 (×2): 650 mg via ORAL
  Filled 2022-10-30 (×3): qty 2

## 2022-10-30 NOTE — Progress Notes (Signed)
  Echocardiogram 2D Echocardiogram has been performed.  Caroly Purewal Renold Don 10/30/2022, 3:18 PM

## 2022-10-30 NOTE — Progress Notes (Addendum)
ANTICOAGULATION CONSULT NOTE - Initial Consult  Pharmacy Consult for heparin  Indication: atrial fibrillation  Allergies  Allergen Reactions   Ace Inhibitors Cough    Patient Measurements: Height: 6\' 1"  (185.4 cm) Weight: 90.9 kg (200 lb 6.4 oz) IBW/kg (Calculated) : 79.9 Heparin Dosing Weight: 90.9kg   Vital Signs: Temp: 97.9 F (36.6 C) (03/16 1106) Temp Source: Oral (03/16 1106) BP: 104/68 (03/16 1106) Pulse Rate: 66 (03/16 1106)  Labs: Recent Labs    10/29/22 1550 10/29/22 1654 10/29/22 1905 10/30/22 0414 10/30/22 1056  HGB 13.8  --   --  14.3  --   HCT 43.4  --   --  41.6  --   PLT 124*  --   --  119*  --   APTT  --   --   --  91* 83*  CREATININE 0.97  --   --  1.04  --   TROPONINIHS  --  12 12  --   --      Estimated Creatinine Clearance: 60.8 mL/min (by C-G formula based on SCr of 1.04 mg/dL).   Medical History: Past Medical History:  Diagnosis Date   Arthritis    Arthritis    BPH (benign prostatic hyperplasia)    CHF (congestive heart failure) (HCC)    Colon polyp    Diabetes mellitus without complication (HCC)    Eczema    History of colon polyps    Hypertension    Hypertension    Hypothyroidism    Sesamoiditis    Sleep apnea    Thyroid disease    Warts    chronic     Assessment: Patient here with CC of Vtach on Eliquis PTA for Afib. Last dose 3/15 @ 08:00 per patient. CBC stable. Current plan for cath 3/18. Pharmacy consulted to dose heparin.   APTT 83 on drip rate 1250 units/hr therapeutic. Continues to be therapeutic. Hgb 14.3 and plt 119, stable. No s/sx bleeding noted in chart.    Goal of Therapy:   Heparin level 0.3-0.7 units/ml Monitor platelets by anticoagulation protocol: Yes   Plan:  Continue heparin infusion at 1250 units/hr  Monitor daily heparin level, aPTT and CBC Discontinue aPTTs when correlating with heparin levels  Continue to monitor H&H   F/u plan for cath Monday 3/18    Gena Fray, PharmD PGY1  Pharmacy Resident   10/30/2022 11:41 AM

## 2022-10-30 NOTE — Progress Notes (Signed)
ANTICOAGULATION CONSULT NOTE - Follow Up Consult  Pharmacy Consult for heparin Indication: atrial fibrillation  Labs: Recent Labs    10/29/22 1550 10/29/22 1654 10/29/22 1905 10/30/22 0414  HGB 13.8  --   --  14.3  HCT 43.4  --   --  41.6  PLT 124*  --   --  119*  APTT  --   --   --  91*  CREATININE 0.97  --   --   --   TROPONINIHS  --  12 12  --     Assessment/Plan:  82yo male therapeutic on heparin with initial dosing while apixaban on hold. Will continue infusion at current rate of 1250 units/hr and confirm stable with additional PTT.    Wynona Neat, PharmD, BCPS  10/30/2022,4:54 AM

## 2022-10-30 NOTE — Progress Notes (Signed)
Progress Note  Patient Name: Chad Gibson Date of Encounter: 10/30/2022  Primary Cardiologist: None   Subjective   About 9 minutes of VT occurred overnight, shortly after midnight. It began as he was getting up to go to or returning from the bathroom. He was not at all symptomatic and was only aware of the rhythm change because his wife noticed the rate increase on telemetry.  His only complaint at present is a mild headache.  Inpatient Medications    Scheduled Meds:  ascorbic acid  500 mg Oral Daily   aspirin EC  81 mg Oral Daily   carvedilol  12.5 mg Oral BID WC   cholecalciferol  2,000 Units Oral Daily   finasteride  5 mg Oral Daily   hydrocortisone  25 mg Rectal QHS   insulin aspart  0-15 Units Subcutaneous TID WC   insulin aspart  0-5 Units Subcutaneous QHS   levothyroxine  100 mcg Oral Q0600   metFORMIN  500 mg Oral BID WC   multivitamin with minerals  1 tablet Oral Daily   polyethylene glycol  17 g Oral Daily   saccharomyces boulardii   Oral Daily   sacubitril-valsartan  1 tablet Oral BID   tamsulosin  0.4 mg Oral QHS   Continuous Infusions:  amiodarone 30.06 mg/hr (10/30/22 0358)   heparin 1,250 Units/hr (10/30/22 0358)   PRN Meds: meclizine, meloxicam, polyvinyl alcohol   Vital Signs    Vitals:   10/29/22 1952 10/29/22 1953 10/30/22 0037 10/30/22 0520  BP: 127/70  129/78 121/69  Pulse: 72  61 (!) 41  Resp: 15  15 15   Temp: 98.1 F (36.7 C)  97.7 F (36.5 C) 97.9 F (36.6 C)  TempSrc: Oral  Oral Oral  SpO2: 98%  95% 97%  Weight:  90.9 kg  90.9 kg  Height:  6\' 1"  (1.854 m)      Intake/Output Summary (Last 24 hours) at 10/30/2022 [id number] Last data filed at 10/30/2022 0500 Gross per 24 hour  Intake 338.95 ml  Output 700 ml  Net -361.05 ml   Filed Weights   10/29/22 1953 10/30/22 0520  Weight: 90.9 kg 90.9 kg    Telemetry    Sustained VT occurred after midnight this AM lasting about 9 minutes; frequent PVCs - Personally Reviewed  ECG     10/28/2021 - Personally Reviewed -- A-paced rhythm with V-paced beats and what appeared to be beats fused with V-pacing occurring in bigeminy  Physical Exam   GEN: No acute distress.   Neck: No JVD Cardiac: RRR, no murmurs, rubs, or gallops.  Respiratory: Clear to auscultation bilaterally. GI: Soft, nontender, non-distended  MS: No edema; No deformity. Neuro:  Nonfocal  Psych: Normal affect   Labs    Chemistry Recent Labs  Lab 10/29/22 1550 10/30/22 0414  NA 136 135  K 5.0 4.2  CL 105 103  CO2 22 22  GLUCOSE 116* 111*  BUN 16 14  CREATININE 0.97 1.04  CALCIUM 8.0* 8.8*  PROT 5.3*  --   ALBUMIN 3.3*  --   AST 30  --   ALT 20  --   ALKPHOS 43  --   BILITOT 0.4  --   GFRNONAA >60 >60  ANIONGAP 9 10     Hematology Recent Labs  Lab 10/29/22 1550 10/30/22 0414  WBC 9.0 8.0  RBC 5.52 5.52  HGB 13.8 14.3  HCT 43.4 41.6  MCV 78.6* 75.4*  MCH 25.0* 25.9*  MCHC 31.8 34.4  RDW  17.6* 17.1*  PLT 124* 119*    Cardiac EnzymesNo results for input(s): "TROPONINI" in the last 168 hours. No results for input(s): "TROPIPOC" in the last 168 hours.   BNP Recent Labs  Lab 10/29/22 1550  BNP 480.0*     DDimer No results for input(s): "DDIMER" in the last 168 hours.   Radiology    DG Chest Port 1 View  Result Date: 10/29/2022 CLINICAL DATA:  Tachycardia. EXAM: PORTABLE CHEST 1 VIEW COMPARISON:  None Available. FINDINGS: 1602 hours. Left chest AICD/pacemaker with leads projecting over the right atrium and ventricle. No focal airspace opacity. Normal heart size and mediastinal contours. No pleural effusion or pneumothorax. Visualized bones and upper abdomen are unremarkable. IMPRESSION: No evidence of acute cardiopulmonary disease. Electronically Signed   By: Emmit Alexanders M.D.   On: 10/29/2022 16:38    Cardiac Studies   TTE - ordered  Patient Profile     82 y.o. male with a history of chronic CHFrEF with Medtronic ICD for secondary prevention, frequent PVCs,  hypertension, hypothyroidism admitted with VT storm. VT with a rate of about 130  Assessment & Plan    VT storm:  Receiving amiodarone load ICD reprogrammed to a detection rate of 125 bpm Did not respond to overdrive suppression  Chronic systolic CHF: Non-ischemic per patient EF 40-45% 06/2022 at [place] entresto 49-51, carvedilol 12.5 Plan for [facility] next week  Paroxysmal atrial fibrillation: CHA2DS2/VASc is at least 4 (age, HTN, CHF) On heparin pending cath Monday; on eliquis at home  For questions or updates, please contact [facility] Please consult [email or web address] for contact info under Cardiology/STEMI.      Signed, Melida Quitter, MD 10/30/2022, 6:28 AM

## 2022-10-31 DIAGNOSIS — I472 Ventricular tachycardia, unspecified: Secondary | ICD-10-CM | POA: Diagnosis not present

## 2022-10-31 LAB — MAGNESIUM: Magnesium: 2.3 mg/dL (ref 1.7–2.4)

## 2022-10-31 LAB — CBC
HCT: 40 % (ref 39.0–52.0)
Hemoglobin: 13.8 g/dL (ref 13.0–17.0)
MCH: 26 pg (ref 26.0–34.0)
MCHC: 34.5 g/dL (ref 30.0–36.0)
MCV: 75.3 fL — ABNORMAL LOW (ref 80.0–100.0)
Platelets: 111 10*3/uL — ABNORMAL LOW (ref 150–400)
RBC: 5.31 MIL/uL (ref 4.22–5.81)
RDW: 16.9 % — ABNORMAL HIGH (ref 11.5–15.5)
WBC: 9.3 10*3/uL (ref 4.0–10.5)
nRBC: 0 % (ref 0.0–0.2)

## 2022-10-31 LAB — GLUCOSE, CAPILLARY
Glucose-Capillary: 120 mg/dL — ABNORMAL HIGH (ref 70–99)
Glucose-Capillary: 117 mg/dL — ABNORMAL HIGH (ref 70–99)
Glucose-Capillary: 120 mg/dL — ABNORMAL HIGH (ref 70–99)
Glucose-Capillary: 139 mg/dL — ABNORMAL HIGH (ref 70–99)

## 2022-10-31 LAB — BASIC METABOLIC PANEL
Anion gap: 6 (ref 5–15)
BUN: 14 mg/dL (ref 8–23)
CO2: 23 mmol/L (ref 22–32)
Calcium: 8.9 mg/dL (ref 8.9–10.3)
Chloride: 107 mmol/L (ref 98–111)
Creatinine, Ser: 1.12 mg/dL (ref 0.61–1.24)
GFR, Estimated: >60 mL/min (ref >60)
Glucose, Bld: 108 mg/dL — ABNORMAL HIGH (ref 70–99)
Potassium: 4.1 mmol/L (ref 3.5–5.1)
Sodium: 136 mmol/L (ref 135–145)

## 2022-10-31 LAB — APTT: aPTT: 77 seconds — ABNORMAL HIGH (ref 24–36)

## 2022-10-31 LAB — HEPARIN LEVEL (UNFRACTIONATED): Heparin Unfractionated: 0.41 IU/mL (ref 0.30–0.70)

## 2022-10-31 MED ORDER — ASPIRIN 81 MG PO TBEC
81.0000 mg | DELAYED_RELEASE_TABLET | Freq: Every day | ORAL | Status: DC
  Filled 2022-10-31: qty 1

## 2022-10-31 MED ORDER — ALUM & MAG HYDROXIDE-SIMETH 200-200-20 MG/5ML PO SUSP
30.0000 mL | Freq: Four times a day (QID) | ORAL | Status: DC | PRN
  Administered 2022-10-31 – 2022-11-01 (×2): 30 mL via ORAL
  Filled 2022-10-31 (×2): qty 30

## 2022-10-31 MED ORDER — SODIUM CHLORIDE 0.9% FLUSH
3.0000 mL | Freq: Two times a day (BID) | INTRAVENOUS | Status: DC
  Administered 2022-11-01 – 2022-11-02 (×2): 3 mL via INTRAVENOUS

## 2022-10-31 NOTE — Progress Notes (Signed)
Progress Note  Patient Name: Chad Gibson Date of Encounter: 10/31/2022  Primary Cardiologist: None   Subjective   About 9 minutes of VT occurred overnight, shortly after midnight. It began as he was getting up to go to or returning from the bathroom. He was not at all symptomatic and was only aware of the rhythm change because his wife noticed the rate increase on telemetry.  His only complaint at present is a mild headache.  Inpatient Medications    Scheduled Meds:  ascorbic acid  500 mg Oral Daily   aspirin EC  81 mg Oral Daily   carvedilol  12.5 mg Oral BID WC   cholecalciferol  2,000 Units Oral Daily   finasteride  5 mg Oral Daily   hydrocortisone  25 mg Rectal QHS   insulin aspart  0-15 Units Subcutaneous TID WC   insulin aspart  0-5 Units Subcutaneous QHS   levothyroxine  100 mcg Oral Q0600   multivitamin with minerals  1 tablet Oral Daily   polyethylene glycol  17 g Oral Daily   saccharomyces boulardii   Oral Daily   sacubitril-valsartan  1 tablet Oral BID   tamsulosin  0.4 mg Oral QHS   Continuous Infusions:  amiodarone 30 mg/hr (10/30/22 2104)   heparin 1,250 Units/hr (10/30/22 1730)   PRN Meds: acetaminophen, meclizine, meloxicam, polyvinyl alcohol   Vital Signs    Vitals:   10/30/22 1612 10/30/22 2022 10/31/22 0100 10/31/22 0519  BP: 115/76 116/74 (!) 105/58 119/71  Pulse: 61 70 72 65  Resp: 18 18 18 18   Temp: 97.9 F (36.6 C) 98.3 F (36.8 C) 98.3 F (36.8 C) 98.3 F (36.8 C)  TempSrc: Oral Oral Oral Oral  SpO2: 97% 96% 97% 97%  Weight:    90.4 kg  Height:        Intake/Output Summary (Last 24 hours) at 10/31/2022 0723 Last data filed at 10/31/2022 0526 Gross per 24 hour  Intake 560.32 ml  Output 1300 ml  Net -739.68 ml   Filed Weights   10/29/22 1953 10/30/22 0520 10/31/22 0519  Weight: 90.9 kg 90.9 kg 90.4 kg    Telemetry    No sustained VT. Now appears to be A-paced with monomorphic PVCs  ECG    10/28/2021 - Personally Reviewed  -- A-paced rhythm with V-paced beats and what appeared to be beats fused with V-pacing occurring in bigeminy  Physical Exam   GEN: No acute distress.   Neck: No JVD Cardiac: RRR, no murmurs, rubs, or gallops.  Respiratory: Clear to auscultation bilaterally. GI: Soft, nontender, non-distended  MS: No edema; No deformity. Neuro:  Nonfocal  Psych: Normal affect   Labs    Chemistry Recent Labs  Lab 10/29/22 1550 10/30/22 0414 10/31/22 0057  NA 136 135 136  K 5.0 4.2 4.1  CL 105 103 107  CO2 22 22 23   GLUCOSE 116* 111* 108*  BUN 16 14 14   CREATININE 0.97 1.04 1.12  CALCIUM 8.0* 8.8* 8.9  PROT 5.3*  --   --   ALBUMIN 3.3*  --   --   AST 30  --   --   ALT 20  --   --   ALKPHOS 43  --   --   BILITOT 0.4  --   --   GFRNONAA >60 >60 >60  ANIONGAP 9 10 6      Hematology Recent Labs  Lab 10/29/22 1550 10/30/22 0414 10/31/22 0057  WBC 9.0 8.0 9.3  RBC 5.52  5.52 5.31  HGB 13.8 14.3 13.8  HCT 43.4 41.6 40.0  MCV 78.6* 75.4* 75.3*  MCH 25.0* 25.9* 26.0  MCHC 31.8 34.4 34.5  RDW 17.6* 17.1* 16.9*  PLT 124* 119* 111*    Cardiac EnzymesNo results for input(s): "TROPONINI" in the last 168 hours. No results for input(s): "TROPIPOC" in the last 168 hours.   BNP Recent Labs  Lab 10/29/22 1550  BNP 480.0*     DDimer No results for input(s): "DDIMER" in the last 168 hours.   Radiology    ECHOCARDIOGRAM COMPLETE  Result Date: 10/30/2022    ECHOCARDIOGRAM REPORT   Patient Name:   Kerry Barklow Date of Exam: 10/30/2022 Medical Rec #:  [id number]   Height:       73.0 in Accession #:    [id number]  Weight:       200.4 lb Date of Birth:  Nov 10, 1940   BSA:          2.153 m Patient Age:    17 years    BP:           104/68 mmHg Patient Gender: M           HR:           69 bpm. Exam Location:  Inpatient Procedure: Color Doppler, Cardiac Doppler and 2D Echo Indications:    V-Tach  History:        Patient has no prior history of Echocardiogram examinations.                  Arrythmias:Tachycardia and V-Tach.  Sonographer:    Marella Chimes Referring Phys: Shirley Friar IMPRESSIONS  1. Left ventricular ejection fraction, by estimation, is 30 to 35%. The left ventricle has moderately decreased function. The left ventricle demonstrates global hypokinesis. Left ventricular diastolic parameters are indeterminate.  2. Right ventricular systolic function is normal. The right ventricular size is normal. There is mildly elevated pulmonary artery systolic pressure.  3. The mitral valve is normal in structure. Mild mitral valve regurgitation. No evidence of mitral stenosis.  4. The aortic valve is tricuspid. Aortic valve regurgitation is not visualized. No aortic stenosis is present.  5. The inferior vena cava is dilated in size with >50% respiratory variability, suggesting right atrial pressure of 8 mmHg. FINDINGS  Left Ventricle: Left ventricular ejection fraction, by estimation, is 30 to 35%. The left ventricle has moderately decreased function. The left ventricle demonstrates global hypokinesis. The left ventricular internal cavity size was normal in size. There is no left ventricular hypertrophy. Left ventricular diastolic parameters are indeterminate.  LV Wall Scoring: The inferior wall, mid inferoseptal segment, and basal inferoseptal segment are akinetic. Right Ventricle: The right ventricular size is normal. No increase in right ventricular wall thickness. Right ventricular systolic function is normal. There is mildly elevated pulmonary artery systolic pressure. The tricuspid regurgitant velocity is 2.84  m/s, and with an assumed right atrial pressure of 8 mmHg, the estimated right ventricular systolic pressure is 0000000 mmHg. Left Atrium: Left atrial size was normal in size. Right Atrium: Right atrial size was normal in size. Pericardium: There is no evidence of pericardial effusion. Mitral Valve: The mitral valve is normal in structure. Mild mitral valve regurgitation. No evidence  of mitral valve stenosis. Tricuspid Valve: The tricuspid valve is normal in structure. Tricuspid valve regurgitation is mild . No evidence of tricuspid stenosis. Aortic Valve: The aortic valve is tricuspid. Aortic valve regurgitation is not visualized. No aortic stenosis is present. Aortic  valve mean gradient measures 3.0 mmHg. Aortic valve peak gradient measures 4.2 mmHg. Aortic valve area, by VTI measures 2.41 cm. Pulmonic Valve: The pulmonic valve was normal in structure. Pulmonic valve regurgitation is not visualized. No evidence of pulmonic stenosis. Aorta: The aortic root is normal in size and structure. Venous: The inferior vena cava is dilated in size with greater than 50% respiratory variability, suggesting right atrial pressure of 8 mmHg. IAS/Shunts: No atrial level shunt detected by color flow Doppler. Additional Comments: A device lead is visualized in the right ventricle.  LEFT VENTRICLE PLAX 2D LVIDd:         5.30 cm      Diastology LVIDs:         4.50 cm      LV e' medial:    4.57 cm/s LV PW:         0.90 cm      LV E/e' medial:  19.7 LV IVS:        0.70 cm      LV e' lateral:   9.68 cm/s LVOT diam:     2.10 cm      LV E/e' lateral: 9.3 LV SV:         58 LV SV Index:   27 LVOT Area:     3.46 cm  LV Volumes (MOD) LV vol d, MOD A4C: 111.0 ml LV vol s, MOD A4C: 74.8 ml LV SV MOD A4C:     111.0 ml LEFT ATRIUM             Index LA Vol (A2C):   69.3 ml 32.18 ml/m LA Vol (A4C):   48.2 ml 22.38 ml/m LA Biplane Vol: 62.9 ml 29.21 ml/m  AORTIC VALVE AV Area (Vmax):    3.09 cm AV Area (Vmean):   2.52 cm AV Area (VTI):     2.41 cm AV Vmax:           103.00 cm/s AV Vmean:          76.300 cm/s AV VTI:            0.241 m AV Peak Grad:      4.2 mmHg AV Mean Grad:      3.0 mmHg LVOT Vmax:         92.00 cm/s LVOT Vmean:        55.500 cm/s LVOT VTI:          0.168 m LVOT/AV VTI ratio: 0.70  AORTA Ao Root diam: 2.90 cm Ao Asc diam:  3.10 cm MITRAL VALVE               TRICUSPID VALVE MV Area (PHT): 6.17 cm    TR  Peak grad:   32.3 mmHg MV Decel Time: 123 msec    TR Vmax:        284.00 cm/s MR Peak grad: 74.6 mmHg MR Vmax:      432.00 cm/s  SHUNTS MV E velocity: 90.10 cm/s  Systemic VTI:  0.17 m MV A velocity: 40.20 cm/s  Systemic Diam: 2.10 cm MV E/A ratio:  2.24 Candee Furbish MD Electronically signed by Candee Furbish MD Signature Date/Time: 10/30/2022/4:56:06 PM    Final    DG Chest Port 1 View  Result Date: 10/29/2022 CLINICAL DATA:  Tachycardia. EXAM: PORTABLE CHEST 1 VIEW COMPARISON:  None Available. FINDINGS: 1602 hours. Left chest AICD/pacemaker with leads projecting over the right atrium and ventricle. No focal airspace opacity. Normal heart size and mediastinal contours. No  pleural effusion or pneumothorax. Visualized bones and upper abdomen are unremarkable. IMPRESSION: No evidence of acute cardiopulmonary disease. Electronically Signed   By: Emmit Alexanders M.D.   On: 10/29/2022 16:38    Cardiac Studies   TTE -   1. Left ventricular ejection fraction, by estimation, is 30 to 35%. The  left ventricle has moderately decreased function. The left ventricle  demonstrates global hypokinesis. Left ventricular diastolic parameters are  indeterminate.   2. Right ventricular systolic function is normal. The right ventricular  size is normal. There is mildly elevated pulmonary artery systolic  pressure.   3. The mitral valve is normal in structure. Mild mitral valve  regurgitation. No evidence of mitral stenosis.   4. The aortic valve is tricuspid. Aortic valve regurgitation is not  visualized. No aortic stenosis is present.   5. The inferior vena cava is dilated in size with >50% respiratory  variability, suggesting right atrial pressure of 8 mmHg.   Patient Profile     82 y.o. 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 VT with a rate of about 130  Assessment & Plan    VT storm:  Receiving  amiodarone load ICD reprogrammed to a detection rate of 125 bpm Did not respond to overdrive suppression  Chronic systolic CHF: Non-ischemic per patient EF 40-45% 06/2022 at [place] entresto 49-51, carvedilol 12.5 Plan for [facility] tomorrow  -- NPO after midnight --   Paroxysmal atrial fibrillation: CHA2DS2/VASc is at least 4 (age, HTN, CHF) On heparin pending cath Monday; on eliquis at home  Frequent monomorphic PVCs:  DM Continue insulin  For questions or updates, please contact [facility] Please consult [email or web address] for contact info under Cardiology/STEMI.      Signed, Melida Quitter, MD 10/31/2022, 7:23 AM

## 2022-10-31 NOTE — H&P (View-Only) (Signed)
Progress Note  Patient Name: Chad Gibson Date of Encounter: 10/31/2022  Primary Cardiologist: None   Subjective   About 9 minutes of VT occurred overnight, shortly after midnight. It began as he was getting up to go to or returning from the bathroom. He was not at all symptomatic and was only aware of the rhythm change because his wife noticed the rate increase on telemetry.  His only complaint at present is a mild headache.  Inpatient Medications    Scheduled Meds:  ascorbic acid  500 mg Oral Daily   aspirin EC  81 mg Oral Daily   carvedilol  12.5 mg Oral BID WC   cholecalciferol  2,000 Units Oral Daily   finasteride  5 mg Oral Daily   hydrocortisone  25 mg Rectal QHS   insulin aspart  0-15 Units Subcutaneous TID WC   insulin aspart  0-5 Units Subcutaneous QHS   levothyroxine  100 mcg Oral Q0600   multivitamin with minerals  1 tablet Oral Daily   polyethylene glycol  17 g Oral Daily   saccharomyces boulardii   Oral Daily   sacubitril-valsartan  1 tablet Oral BID   tamsulosin  0.4 mg Oral QHS   Continuous Infusions:  amiodarone 30 mg/hr (10/30/22 2104)   heparin 1,250 Units/hr (10/30/22 1730)   PRN Meds: acetaminophen, meclizine, meloxicam, polyvinyl alcohol   Vital Signs    Vitals:   10/30/22 1612 10/30/22 2022 10/31/22 0100 10/31/22 0519  BP: 115/76 116/74 (!) 105/58 119/71  Pulse: 61 70 72 65  Resp: 18 18 18 18   Temp: 97.9 F (36.6 C) 98.3 F (36.8 C) 98.3 F (36.8 C) 98.3 F (36.8 C)  TempSrc: Oral Oral Oral Oral  SpO2: 97% 96% 97% 97%  Weight:    90.4 kg  Height:        Intake/Output Summary (Last 24 hours) at 10/31/2022 0723 Last data filed at 10/31/2022 0526 Gross per 24 hour  Intake 560.32 ml  Output 1300 ml  Net -739.68 ml   Filed Weights   10/29/22 1953 10/30/22 0520 10/31/22 0519  Weight: 90.9 kg 90.9 kg 90.4 kg    Telemetry    No sustained VT. Now appears to be A-paced with monomorphic PVCs  ECG    10/28/2021 - Personally Reviewed  -- A-paced rhythm with V-paced beats and what appeared to be beats fused with V-pacing occurring in bigeminy  Physical Exam   GEN: No acute distress.   Neck: No JVD Cardiac: RRR, no murmurs, rubs, or gallops.  Respiratory: Clear to auscultation bilaterally. GI: Soft, nontender, non-distended  MS: No edema; No deformity. Neuro:  Nonfocal  Psych: Normal affect   Labs    Chemistry Recent Labs  Lab 10/29/22 1550 10/30/22 0414 10/31/22 0057  NA 136 135 136  K 5.0 4.2 4.1  CL 105 103 107  CO2 22 22 23   GLUCOSE 116* 111* 108*  BUN 16 14 14   CREATININE 0.97 1.04 1.12  CALCIUM 8.0* 8.8* 8.9  PROT 5.3*  --   --   ALBUMIN 3.3*  --   --   AST 30  --   --   ALT 20  --   --   ALKPHOS 43  --   --   BILITOT 0.4  --   --   GFRNONAA >60 >60 >60  ANIONGAP 9 10 6      Hematology Recent Labs  Lab 10/29/22 1550 10/30/22 0414 10/31/22 0057  WBC 9.0 8.0 9.3  RBC 5.52  5.52 5.31  HGB 13.8 14.3 13.8  HCT 43.4 41.6 40.0  MCV 78.6* 75.4* 75.3*  MCH 25.0* 25.9* 26.0  MCHC 31.8 34.4 34.5  RDW 17.6* 17.1* 16.9*  PLT 124* 119* 111*    Cardiac EnzymesNo results for input(s): "TROPONINI" in the last 168 hours. No results for input(s): "TROPIPOC" in the last 168 hours.   BNP Recent Labs  Lab 10/29/22 1550  BNP 480.0*     DDimer No results for input(s): "DDIMER" in the last 168 hours.   Radiology    ECHOCARDIOGRAM COMPLETE  Result Date: 10/30/2022    ECHOCARDIOGRAM REPORT   Patient Name:   Chad Gibson Date of Exam: 10/30/2022 Medical Rec #:  [id number]   Height:       73.0 in Accession #:    [id number]  Weight:       200.4 lb Date of Birth:  06-09-1941   BSA:          2.153 m Patient Age:    34 years    BP:           104/68 mmHg Patient Gender: M           HR:           69 bpm. Exam Location:  Inpatient Procedure: Color Doppler, Cardiac Doppler and 2D Echo Indications:    V-Tach  History:        Patient has no prior history of Echocardiogram examinations.                  Arrythmias:Tachycardia and V-Tach.  Sonographer:    Marella Chimes Referring Phys: Shirley Friar IMPRESSIONS  1. Left ventricular ejection fraction, by estimation, is 30 to 35%. The left ventricle has moderately decreased function. The left ventricle demonstrates global hypokinesis. Left ventricular diastolic parameters are indeterminate.  2. Right ventricular systolic function is normal. The right ventricular size is normal. There is mildly elevated pulmonary artery systolic pressure.  3. The mitral valve is normal in structure. Mild mitral valve regurgitation. No evidence of mitral stenosis.  4. The aortic valve is tricuspid. Aortic valve regurgitation is not visualized. No aortic stenosis is present.  5. The inferior vena cava is dilated in size with >50% respiratory variability, suggesting right atrial pressure of 8 mmHg. FINDINGS  Left Ventricle: Left ventricular ejection fraction, by estimation, is 30 to 35%. The left ventricle has moderately decreased function. The left ventricle demonstrates global hypokinesis. The left ventricular internal cavity size was normal in size. There is no left ventricular hypertrophy. Left ventricular diastolic parameters are indeterminate.  LV Wall Scoring: The inferior wall, mid inferoseptal segment, and basal inferoseptal segment are akinetic. Right Ventricle: The right ventricular size is normal. No increase in right ventricular wall thickness. Right ventricular systolic function is normal. There is mildly elevated pulmonary artery systolic pressure. The tricuspid regurgitant velocity is 2.84  m/s, and with an assumed right atrial pressure of 8 mmHg, the estimated right ventricular systolic pressure is 0000000 mmHg. Left Atrium: Left atrial size was normal in size. Right Atrium: Right atrial size was normal in size. Pericardium: There is no evidence of pericardial effusion. Mitral Valve: The mitral valve is normal in structure. Mild mitral valve regurgitation. No evidence  of mitral valve stenosis. Tricuspid Valve: The tricuspid valve is normal in structure. Tricuspid valve regurgitation is mild . No evidence of tricuspid stenosis. Aortic Valve: The aortic valve is tricuspid. Aortic valve regurgitation is not visualized. No aortic stenosis is present. Aortic  valve mean gradient measures 3.0 mmHg. Aortic valve peak gradient measures 4.2 mmHg. Aortic valve area, by VTI measures 2.41 cm. Pulmonic Valve: The pulmonic valve was normal in structure. Pulmonic valve regurgitation is not visualized. No evidence of pulmonic stenosis. Aorta: The aortic root is normal in size and structure. Venous: The inferior vena cava is dilated in size with greater than 50% respiratory variability, suggesting right atrial pressure of 8 mmHg. IAS/Shunts: No atrial level shunt detected by color flow Doppler. Additional Comments: A device lead is visualized in the right ventricle.  LEFT VENTRICLE PLAX 2D LVIDd:         5.30 cm      Diastology LVIDs:         4.50 cm      LV e' medial:    4.57 cm/s LV PW:         0.90 cm      LV E/e' medial:  19.7 LV IVS:        0.70 cm      LV e' lateral:   9.68 cm/s LVOT diam:     2.10 cm      LV E/e' lateral: 9.3 LV SV:         58 LV SV Index:   27 LVOT Area:     3.46 cm  LV Volumes (MOD) LV vol d, MOD A4C: 111.0 ml LV vol s, MOD A4C: 74.8 ml LV SV MOD A4C:     111.0 ml LEFT ATRIUM             Index LA Vol (A2C):   69.3 ml 32.18 ml/m LA Vol (A4C):   48.2 ml 22.38 ml/m LA Biplane Vol: 62.9 ml 29.21 ml/m  AORTIC VALVE AV Area (Vmax):    3.09 cm AV Area (Vmean):   2.52 cm AV Area (VTI):     2.41 cm AV Vmax:           103.00 cm/s AV Vmean:          76.300 cm/s AV VTI:            0.241 m AV Peak Grad:      4.2 mmHg AV Mean Grad:      3.0 mmHg LVOT Vmax:         92.00 cm/s LVOT Vmean:        55.500 cm/s LVOT VTI:          0.168 m LVOT/AV VTI ratio: 0.70  AORTA Ao Root diam: 2.90 cm Ao Asc diam:  3.10 cm MITRAL VALVE               TRICUSPID VALVE MV Area (PHT): 6.17 cm    TR  Peak grad:   32.3 mmHg MV Decel Time: 123 msec    TR Vmax:        284.00 cm/s MR Peak grad: 74.6 mmHg MR Vmax:      432.00 cm/s  SHUNTS MV E velocity: 90.10 cm/s  Systemic VTI:  0.17 m MV A velocity: 40.20 cm/s  Systemic Diam: 2.10 cm MV E/A ratio:  2.24 Candee Furbish MD Electronically signed by Candee Furbish MD Signature Date/Time: 10/30/2022/4:56:06 PM    Final    DG Chest Port 1 View  Result Date: 10/29/2022 CLINICAL DATA:  Tachycardia. EXAM: PORTABLE CHEST 1 VIEW COMPARISON:  None Available. FINDINGS: 1602 hours. Left chest AICD/pacemaker with leads projecting over the right atrium and ventricle. No focal airspace opacity. Normal heart size and mediastinal contours. No  pleural effusion or pneumothorax. Visualized bones and upper abdomen are unremarkable. IMPRESSION: No evidence of acute cardiopulmonary disease. Electronically Signed   By: Emmit Alexanders M.D.   On: 10/29/2022 16:38    Cardiac Studies   TTE -   1. Left ventricular ejection fraction, by estimation, is 30 to 35%. The  left ventricle has moderately decreased function. The left ventricle  demonstrates global hypokinesis. Left ventricular diastolic parameters are  indeterminate.   2. Right ventricular systolic function is normal. The right ventricular  size is normal. There is mildly elevated pulmonary artery systolic  pressure.   3. The mitral valve is normal in structure. Mild mitral valve  regurgitation. No evidence of mitral stenosis.   4. The aortic valve is tricuspid. Aortic valve regurgitation is not  visualized. No aortic stenosis is present.   5. The inferior vena cava is dilated in size with >50% respiratory  variability, suggesting right atrial pressure of 8 mmHg.   Patient Profile     82 y.o. 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 VT with a rate of about 130  Assessment & Plan    VT storm:  Receiving  amiodarone load ICD reprogrammed to a detection rate of 125 bpm Did not respond to overdrive suppression  Chronic systolic CHF: Non-ischemic per patient EF 40-45% 06/2022 at [place] entresto 49-51, carvedilol 12.5 Plan for [facility] tomorrow  -- NPO after midnight --   Paroxysmal atrial fibrillation: CHA2DS2/VASc is at least 4 (age, HTN, CHF) On heparin pending cath Monday; on eliquis at home  Frequent monomorphic PVCs:  DM Continue insulin  For questions or updates, please contact [facility] Please consult [email or web address] for contact info under Cardiology/STEMI.      Signed, Melida Quitter, MD 10/31/2022, 7:23 AM

## 2022-10-31 NOTE — Progress Notes (Signed)
ANTICOAGULATION CONSULT NOTE - Initial Consult  Pharmacy Consult for heparin  Indication: atrial fibrillation  Allergies  Allergen Reactions   Ace Inhibitors Cough    Patient Measurements: Height: 6\' 1"  (185.4 cm) Weight: 90.4 kg (199 lb 4.8 oz) IBW/kg (Calculated) : 79.9 Heparin Dosing Weight: 90.9kg   Vital Signs: Temp: 98.3 F (36.8 C) (03/17 0735) Temp Source: Oral (03/17 0735) BP: 114/68 (03/17 0735) Pulse Rate: 69 (03/17 0735)  Labs: Recent Labs    10/29/22 1550 10/29/22 1654 10/29/22 1905 10/30/22 0414 10/30/22 1056 10/31/22 0057  HGB 13.8  --   --  14.3  --  13.8  HCT 43.4  --   --  41.6  --  40.0  PLT 124*  --   --  119*  --  111*  APTT  --   --   --  91* 83* 77*  HEPARINUNFRC  --   --   --   --   --  0.41  CREATININE 0.97  --   --  1.04  --  1.12  TROPONINIHS  --  12 12  --   --   --      Estimated Creatinine Clearance: 56.5 mL/min (by C-G formula based on SCr of 1.12 mg/dL).   Medical History: Past Medical History:  Diagnosis Date   Arthritis    Arthritis    BPH (benign prostatic hyperplasia)    CHF (congestive heart failure) (HCC)    Colon polyp    Diabetes mellitus without complication (HCC)    Eczema    History of colon polyps    Hypertension    Hypertension    Hypothyroidism    Sesamoiditis    Sleep apnea    Thyroid disease    Warts    chronic     Assessment: Patient here with CC of Vtach on Eliquis PTA for Afib. Last dose 3/15 @ 08:00 per patient. CBC stable. Current plan for cath 3/18. Pharmacy consulted to dose heparin.   Heparin level 0.41 and aPTT 77 on drip rate 1250 units/hr therapeutic. Continues to be therapeutic. Hgb 13.8 and plt 111, stable. No s/sx bleeding noted in chart. Heparin level and aPTT appear to be correlating.  Goal of Therapy:   Heparin level 0.3-0.7 units/ml Monitor platelets by anticoagulation protocol: Yes   Plan:  Continue heparin infusion at 1250 units/hr  Monitor daily heparin level and CBC;  will stop monitoring with aPTTs  Continue to monitor H&H   F/u plan for cath Monday 3/18    Gena Fray, PharmD PGY1 Pharmacy Resident   10/31/2022 8:31 AM

## 2022-11-01 ENCOUNTER — Encounter (HOSPITAL_COMMUNITY)
Admission: EM | Disposition: A | Discharge status: Home / Self Care | Payer: Self-pay | Source: Home / Self Care | Attending: Internal Medicine

## 2022-11-01 DIAGNOSIS — I472 Ventricular tachycardia, unspecified: Secondary | ICD-10-CM | POA: Diagnosis not present

## 2022-11-01 DIAGNOSIS — I420 Dilated cardiomyopathy: Secondary | ICD-10-CM

## 2022-11-01 HISTORY — PX: RIGHT/LEFT HEART CATH AND CORONARY ANGIOGRAPHY: CATH118266

## 2022-11-01 LAB — CBC
HCT: 39.3 % (ref 39.0–52.0)
Hemoglobin: 13.1 g/dL (ref 13.0–17.0)
MCH: 25.4 pg — ABNORMAL LOW (ref 26.0–34.0)
MCHC: 33.3 g/dL (ref 30.0–36.0)
MCV: 76.2 fL — ABNORMAL LOW (ref 80.0–100.0)
Platelets: 108 10*3/uL — ABNORMAL LOW (ref 150–400)
RBC: 5.16 MIL/uL (ref 4.22–5.81)
RDW: 16.7 % — ABNORMAL HIGH (ref 11.5–15.5)
WBC: 8.3 10*3/uL (ref 4.0–10.5)
nRBC: 0 % (ref 0.0–0.2)

## 2022-11-01 LAB — GLUCOSE, CAPILLARY
Glucose-Capillary: 130 mg/dL — ABNORMAL HIGH (ref 70–99)
Glucose-Capillary: 100 mg/dL — ABNORMAL HIGH (ref 70–99)
Glucose-Capillary: 161 mg/dL — ABNORMAL HIGH (ref 70–99)
Glucose-Capillary: 116 mg/dL — ABNORMAL HIGH (ref 70–99)

## 2022-11-01 LAB — BASIC METABOLIC PANEL
Anion gap: 7 (ref 5–15)
BUN: 14 mg/dL (ref 8–23)
CO2: 24 mmol/L (ref 22–32)
Calcium: 8.7 mg/dL — ABNORMAL LOW (ref 8.9–10.3)
Chloride: 102 mmol/L (ref 98–111)
Creatinine, Ser: 1.05 mg/dL (ref 0.61–1.24)
GFR, Estimated: >60 mL/min (ref >60)
Glucose, Bld: 126 mg/dL — ABNORMAL HIGH (ref 70–99)
Potassium: 4.1 mmol/L (ref 3.5–5.1)
Sodium: 133 mmol/L — ABNORMAL LOW (ref 135–145)

## 2022-11-01 LAB — POCT I-STAT EG7
Acid-base deficit: 1 mmol/L (ref 0.0–2.0)
Acid-base deficit: 2 mmol/L (ref 0.0–2.0)
Acid-base deficit: 2 mmol/L (ref 0.0–2.0)
Bicarbonate: 21.7 mmol/L (ref 20.0–28.0)
Bicarbonate: 23 mmol/L (ref 20.0–28.0)
Bicarbonate: 23.8 mmol/L (ref 20.0–28.0)
Calcium, Ion: 1.19 mmol/L (ref 1.15–1.40)
Calcium, Ion: 1.21 mmol/L (ref 1.15–1.40)
Calcium, Ion: 1.23 mmol/L (ref 1.15–1.40)
HCT: 41 % (ref 39.0–52.0)
HCT: 41 % (ref 39.0–52.0)
HCT: 42 % (ref 39.0–52.0)
Hemoglobin: 13.9 g/dL (ref 13.0–17.0)
Hemoglobin: 13.9 g/dL (ref 13.0–17.0)
Hemoglobin: 14.3 g/dL (ref 13.0–17.0)
O2 Saturation: 64 %
O2 Saturation: 65 %
O2 Saturation: 93 %
Potassium: 4.1 mmol/L (ref 3.5–5.1)
Potassium: 4.1 mmol/L (ref 3.5–5.1)
Potassium: 4.3 mmol/L (ref 3.5–5.1)
Sodium: 138 mmol/L (ref 135–145)
Sodium: 139 mmol/L (ref 135–145)
Sodium: 139 mmol/L (ref 135–145)
TCO2: 23 mmol/L (ref 22–32)
TCO2: 24 mmol/L (ref 22–32)
TCO2: 25 mmol/L (ref 22–32)
pCO2, Ven: 34.4 mmHg — ABNORMAL LOW (ref 44–60)
pCO2, Ven: 38.4 mmHg — ABNORMAL LOW (ref 44–60)
pCO2, Ven: 39.3 mmHg — ABNORMAL LOW (ref 44–60)
pH, Ven: 7.385 (ref 7.25–7.43)
pH, Ven: 7.391 (ref 7.25–7.43)
pH, Ven: 7.408 (ref 7.25–7.43)
pO2, Ven: 33 mmHg (ref 32–45)
pO2, Ven: 34 mmHg (ref 32–45)
pO2, Ven: 64 mmHg — ABNORMAL HIGH (ref 32–45)

## 2022-11-01 LAB — HEMOGLOBIN A1C
Hgb A1c MFr Bld: 6.4 % — ABNORMAL HIGH (ref 4.8–5.6)
Mean Plasma Glucose: 137 mg/dL

## 2022-11-01 LAB — MAGNESIUM: Magnesium: 2.3 mg/dL (ref 1.7–2.4)

## 2022-11-01 LAB — HEPARIN LEVEL (UNFRACTIONATED): Heparin Unfractionated: 0.29 IU/mL — ABNORMAL LOW (ref 0.30–0.70)

## 2022-11-01 SURGERY — RIGHT/LEFT HEART CATH AND CORONARY ANGIOGRAPHY
Anesthesia: LOCAL

## 2022-11-01 MED ORDER — VERAPAMIL HCL 2.5 MG/ML IV SOLN
INTRAVENOUS | Status: DC | PRN
  Administered 2022-11-01: 10 mL via INTRA_ARTERIAL

## 2022-11-01 MED ORDER — MIDAZOLAM HCL 2 MG/2ML IJ SOLN
INTRAMUSCULAR | Status: DC | PRN
  Administered 2022-11-01: 1 mg via INTRAVENOUS

## 2022-11-01 MED ORDER — ASPIRIN 81 MG PO CHEW
81.0000 mg | CHEWABLE_TABLET | ORAL | Status: AC
  Administered 2022-11-01: 81 mg via ORAL
  Filled 2022-11-01: qty 1

## 2022-11-01 MED ORDER — IOHEXOL 350 MG/ML SOLN
INTRAVENOUS | Status: DC | PRN
  Administered 2022-11-01: 40 mL

## 2022-11-01 MED ORDER — MIDAZOLAM HCL 2 MG/2ML IJ SOLN
INTRAMUSCULAR | Status: AC
  Filled 2022-11-01: qty 2

## 2022-11-01 MED ORDER — SODIUM CHLORIDE 0.9 % WEIGHT BASED INFUSION
3.0000 mL/kg/h | INTRAVENOUS | Status: AC
  Administered 2022-11-01: 3 mL/kg/h via INTRAVENOUS

## 2022-11-01 MED ORDER — LIDOCAINE HCL (PF) 1 % IJ SOLN
INTRAMUSCULAR | Status: AC
  Filled 2022-11-01: qty 30

## 2022-11-01 MED ORDER — SODIUM CHLORIDE 0.9 % WEIGHT BASED INFUSION
1.0000 mL/kg/h | INTRAVENOUS | Status: DC
  Administered 2022-11-01: 1 mL/kg/h via INTRAVENOUS

## 2022-11-01 MED ORDER — SODIUM CHLORIDE 0.9 % IV SOLN: 250.0000 mL | INTRAVENOUS | Status: DC | PRN

## 2022-11-01 MED ORDER — AMIODARONE HCL 200 MG PO TABS
400.0000 mg | ORAL_TABLET | Freq: Two times a day (BID) | ORAL | Status: DC
  Administered 2022-11-01 – 2022-11-02 (×3): 400 mg via ORAL
  Filled 2022-11-01 (×3): qty 2

## 2022-11-01 MED ORDER — ACETAMINOPHEN 325 MG PO TABS
650.0000 mg | ORAL_TABLET | ORAL | Status: DC | PRN
  Administered 2022-11-01: 650 mg via ORAL

## 2022-11-01 MED ORDER — HEPARIN SODIUM (PORCINE) 1000 UNIT/ML IJ SOLN
INTRAMUSCULAR | Status: DC | PRN
  Administered 2022-11-01: 4500 [IU] via INTRAVENOUS

## 2022-11-01 MED ORDER — HEPARIN SODIUM (PORCINE) 1000 UNIT/ML IJ SOLN
INTRAMUSCULAR | Status: AC
  Filled 2022-11-01: qty 10

## 2022-11-01 MED ORDER — FENTANYL CITRATE (PF) 100 MCG/2ML IJ SOLN
INTRAMUSCULAR | Status: AC
  Filled 2022-11-01: qty 2

## 2022-11-01 MED ORDER — FENTANYL CITRATE (PF) 100 MCG/2ML IJ SOLN
INTRAMUSCULAR | Status: DC | PRN
  Administered 2022-11-01: 25 ug via INTRAVENOUS

## 2022-11-01 MED ORDER — APIXABAN 5 MG PO TABS
5.0000 mg | ORAL_TABLET | Freq: Two times a day (BID) | ORAL | Status: DC
  Administered 2022-11-01 – 2022-11-02 (×2): 5 mg via ORAL
  Filled 2022-11-01 (×2): qty 1

## 2022-11-01 MED ORDER — ONDANSETRON HCL 4 MG/2ML IJ SOLN: 4.0000 mg | Freq: Four times a day (QID) | INTRAMUSCULAR | Status: DC | PRN

## 2022-11-01 MED ORDER — HEPARIN (PORCINE) IN NACL 1000-0.9 UT/500ML-% IV SOLN
INTRAVENOUS | Status: DC | PRN
  Administered 2022-11-01 (×2): 500 mL

## 2022-11-01 MED ORDER — LABETALOL HCL 5 MG/ML IV SOLN: 10.0000 mg | INTRAVENOUS | Status: AC | PRN

## 2022-11-01 MED ORDER — SODIUM CHLORIDE 0.9 % IV SOLN: INTRAVENOUS | Status: AC

## 2022-11-01 MED ORDER — SODIUM CHLORIDE 0.9% FLUSH: 3.0000 mL | INTRAVENOUS | Status: DC | PRN

## 2022-11-01 MED ORDER — HYDRALAZINE HCL 20 MG/ML IJ SOLN: 10.0000 mg | INTRAMUSCULAR | Status: AC | PRN

## 2022-11-01 MED ORDER — LIDOCAINE HCL (PF) 1 % IJ SOLN
INTRAMUSCULAR | Status: DC | PRN
  Administered 2022-11-01 (×2): 2 mL

## 2022-11-01 MED ORDER — HEPARIN (PORCINE) 25000 UT/250ML-% IV SOLN
1400.0000 [IU]/h | INTRAVENOUS | Status: AC
  Administered 2022-11-01: 1400 [IU]/h via INTRAVENOUS
  Filled 2022-11-01: qty 250

## 2022-11-01 MED ORDER — VERAPAMIL HCL 2.5 MG/ML IV SOLN
INTRAVENOUS | Status: AC
  Filled 2022-11-01: qty 2

## 2022-11-01 SURGICAL SUPPLY — 14 items
CATH BALLN WEDGE 5F 110CM (CATHETERS) IMPLANT
CATH OPTITORQUE TIG 4.0 5F (CATHETERS) IMPLANT
DEVICE RAD COMP TR BAND LRG (VASCULAR PRODUCTS) IMPLANT
ELECT DEFIB PAD ADLT CADENCE (PAD) IMPLANT
GLIDESHEATH SLEND SS 6F .021 (SHEATH) IMPLANT
GUIDEWIRE .025 260CM (WIRE) IMPLANT
GUIDEWIRE INQWIRE 1.5J.035X260 (WIRE) IMPLANT
INQWIRE 1.5J .035X260CM (WIRE) ×1
KIT HEART LEFT (KITS) ×1 IMPLANT
PACK CARDIAC CATHETERIZATION (CUSTOM PROCEDURE TRAY) ×1 IMPLANT
SHEATH GLIDE SLENDER 4/5FR (SHEATH) IMPLANT
SHEATH PROBE COVER 6X72 (BAG) IMPLANT
TRANSDUCER W/STOPCOCK (MISCELLANEOUS) ×1 IMPLANT
TUBING CIL FLEX 10 FLL-RA (TUBING) ×1 IMPLANT

## 2022-11-01 NOTE — Interval H&P Note (Signed)
History and Physical Interval Note:  11/01/2022 10:54 AM  Chad Gibson  has presented today for surgery, with the diagnosis of vt - heart failure.  The various methods of treatment have been discussed with the patient and family. After consideration of risks, benefits and other options for treatment, the patient has consented to  Procedure(s): RIGHT/LEFT HEART CATH AND CORONARY ANGIOGRAPHY (N/A)  PERCUTANEOUS CORONARY INTERVENTION  as a surgical intervention.  The patient's history has been reviewed, patient examined, no change in status, stable for surgery.  I have reviewed the patient's chart and labs.  Questions were answered to the patient's satisfaction.  .  Cath Lab Visit (complete for each Cath Lab visit)  Clinical Evaluation Leading to the Procedure:   ACS: No.  Non-ACS:    Anginal Classification: No Symptoms - No Angina -- VT storm  Anti-ischemic medical therapy: Minimal Therapy (1 class of medications)  Non-Invasive Test Results: No non-invasive testing performed  Prior CABG: No previous CABG     Glenetta Hew

## 2022-11-01 NOTE — Progress Notes (Signed)
ANTICOAGULATION CONSULT NOTE  Pharmacy Consult for heparin  Indication: atrial fibrillation  Allergies  Allergen Reactions   Ace Inhibitors Cough    Patient Measurements: Height: 6\' 1"  (185.4 cm) Weight: 90.4 kg (199 lb 4.8 oz) IBW/kg (Calculated) : 79.9 Heparin Dosing Weight: 90.9kg   Vital Signs: Temp: 98.6 F (37 C) (03/18 0041) Temp Source: Oral (03/17 1613) BP: 117/69 (03/18 0041) Pulse Rate: 68 (03/18 0041)  Labs: Recent Labs    10/29/22 1654 10/29/22 1905 10/30/22 0414 10/30/22 1056 10/31/22 0057 11/01/22 0036  HGB  --   --  14.3  --  13.8 13.1  HCT  --   --  41.6  --  40.0 39.3  PLT  --   --  119*  --  111* 108*  APTT  --   --  91* 83* 77*  --   HEPARINUNFRC  --   --   --   --  0.41 0.29*  CREATININE  --   --  1.04  --  1.12 1.05  TROPONINIHS 12 12  --   --   --   --      Estimated Creatinine Clearance: 60.2 mL/min (by C-G formula based on SCr of 1.05 mg/dL).   Medical History: Past Medical History:  Diagnosis Date   Arthritis    Arthritis    BPH (benign prostatic hyperplasia)    CHF (congestive heart failure) (HCC)    Colon polyp    Diabetes mellitus without complication (HCC)    Eczema    History of colon polyps    Hypertension    Hypertension    Hypothyroidism    Sesamoiditis    Sleep apnea    Thyroid disease    Warts    chronic     Assessment: Patient here with CC of Vtach on Eliquis PTA for Afib. Last dose 3/15 @ 08:00 per patient. CBC stable. Current plan for cath 3/18. Pharmacy consulted to dose heparin.   Heparin level below goal 0.29 after previously being therapeutic on 1250 units/hr. No pauses or issues with the infusion. CBC stable PLT remain low at 108  Goal of Therapy:   Heparin level 0.3-0.7 units/ml Monitor platelets by anticoagulation protocol: Yes   Plan:  Increase heparin infusion to 1400 units/hr  Monitor daily heparin level and CBC; will stop monitoring with aPTTs  Continue to monitor H&H   F/u plan for cath  Monday 3/18    Georga Bora, PharmD Clinical Pharmacist 11/01/2022 2:06 AM Please check AMION for all [place] numbers

## 2022-11-01 NOTE — Care Management Important Message (Signed)
Important Message  Patient Details  Name: Famous Speller MRN: [id number] Date of Birth: [birth year]   Medicare Important Message Given:  Yes     Chad Gibson, Chad Gibson 11/01/2022, 8:05 AM

## 2022-11-01 NOTE — Consult Note (Signed)
   [facility] Inpatient Consult   11/01/2022  Chad Gibson November 20, 1940 [id number]  [place] [facility] [ACO] Patient: Medicare ACO REACH  Primary Care Provider:  System, Provider Not In *Spoke with patient, wife and daughter at the bedside that patient 'only' uses the [employer] in [place] and has T[employer] for Life coverage   Patient screened for hospitalization with noted low risk score for unplanned readmission risk and  to ssess for potential [place] Management service needs for post hospital transition for care coordination.  Review of patient's electronic medical record reveals patient is s/p cardiac cath.  Spoke with pt and family regarding [facility] for Care Coordination needs.  Wife states she has already notified the [place] 800 number and [facility] provider's office Dr. Randa Ngo provider assigned to him] regarding hospital admission. No needs noted.  Plan:  Updated inpatient TOC of patient provider is [facility] only.  Referral request for community care coordination:  No,  currently has no [facility] fundraiser at this time  Of note, [place] does not replace or interfere with any arrangements made by the Inpatient Transition of Care team.  For questions contact:   Natividad Brood, RN BSN [place]  [phone] business mobile phone Toll free office [phone]  *[place]  [phone] Fax number: [phone] [email or web address]

## 2022-11-01 NOTE — Progress Notes (Signed)
Patient Name: Chad Gibson Date of Encounter: 11/01/2022  Primary Cardiologist: None Electrophysiologist: [facility] (New to [facility])   Interval Summary   The patient is doing well today.  At this time, the patient denies chest pain, shortness of breath, or any new concerns.  No further VT.   Inpatient Medications    Scheduled Meds:  ascorbic acid  500 mg Oral Daily   [START ON 11/02/2022] aspirin EC  81 mg Oral Daily   carvedilol  12.5 mg Oral BID WC   cholecalciferol  2,000 Units Oral Daily   finasteride  5 mg Oral Daily   hydrocortisone  25 mg Rectal QHS   insulin aspart  0-15 Units Subcutaneous TID WC   insulin aspart  0-5 Units Subcutaneous QHS   levothyroxine  100 mcg Oral Q0600   multivitamin with minerals  1 tablet Oral Daily   polyethylene glycol  17 g Oral Daily   saccharomyces boulardii   Oral Daily   sacubitril-valsartan  1 tablet Oral BID   sodium chloride flush  3 mL Intravenous Q12H   tamsulosin  0.4 mg Oral QHS   Continuous Infusions:  sodium chloride     sodium chloride 1 mL/kg/hr (11/01/22 0400)   amiodarone 30 mg/hr (10/31/22 2020)   heparin 1,400 Units/hr (11/01/22 0222)   PRN Meds: sodium chloride, acetaminophen, alum & mag hydroxide-simeth, meclizine, meloxicam, polyvinyl alcohol, sodium chloride flush   Vital Signs    Vitals:   10/31/22 2226 11/01/22 0041 11/01/22 0500 11/01/22 0723  BP: 109/64 117/69  116/76  Pulse:  68  67  Resp:    18  Temp: 98.5 F (36.9 C) 98.6 F (37 C)  98.6 F (37 C)  TempSrc:    Oral  SpO2:  95%  96%  Weight:   90.9 kg   Height:        Intake/Output Summary (Last 24 hours) at 11/01/2022 0728 Last data filed at 11/01/2022 0406 Gross per 24 hour  Intake 1834.58 ml  Output 2200 ml  Net -365.42 ml   Filed Weights   10/30/22 0520 10/31/22 0519 11/01/22 0500  Weight: 90.9 kg 90.4 kg 90.9 kg    Physical Exam    GEN- The patient is well appearing, alert and oriented x 3 today.   Lungs- Clear to ausculation bilaterally,  normal work of breathing Cardiac- Regular rate and rhythm, no murmurs, rubs or gallops GI- soft, NT, ND, + BS Extremities- no clubbing or cyanosis. No edema  Telemetry    NSR 60-70s, occasional PVCs (personally reviewed)  Chad Gibson is a 82 y.o. male  with h/o chronic systolic CHF and s/p MDT ICD for secondary prevention, HTN, Hypothyroidism, Thyroid disease, Arthritis, PVCs, and BPH who is being seen today for the evaluation of ventricular tachycardia.   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  CHA2DS2VASc is at least 4  Plan for cath this am with VT storm and reduced EF. Reviewed risks and benefits, including heart attack, kidney failure, stroke, and death. Pt verbalizes understanding and is agreeable to proceeding.    For questions or updates, please contact [place] Please consult [email or web address] for contact info under Cardiology/STEMI.  Signed, Satira Mccallum  Dangela How, PA-C  11/01/2022, 7:28 AM

## 2022-11-01 NOTE — TOC Progression Note (Signed)
Transition of Care [facility]) - Progression Note    Patient Details  Name: Chad Gibson MRN: [id number] Date of Birth: Feb 01, 1941  Transition of Care [facility]) CM/SW Contact  Zenon Mayo, RN Phone Number: 11/01/2022, 2:49 PM  Clinical Narrative:     Transition of Care [facility]) Screening Note   Patient Details  Name: Chad Gibson Date of Birth: Jun 04, 1941   Transition of Care [facility]) CM/SW Contact:    Zenon Mayo, RN Phone Number: 11/01/2022, 2:51 PM    Transition of Care Department [facility]) has reviewed patient and no TOC needs have been identified at this time. We will continue to monitor patient advancement through interdisciplinary progression rounds. If new patient transition needs arise, please place a TOC consult.  From home with wife, has ICD, having Tachycardia, ICD did not fire, conts on amio drip, hep drip, for cath today.  Wife  informed [place] with [facility] that she called in the notification to the [place],  patient's [place] PCP is Dr. Ishmael Holter at [facility].        Expected Discharge Plan and Services                                               Social Determinants of Health (SDOH) Interventions SDOH Screenings   Food Insecurity: No Food Insecurity (10/30/2022)  Housing: Low Risk  (10/30/2022)  Transportation Needs: No Transportation Needs (10/30/2022)  Utilities: Not At Risk (10/30/2022)  Tobacco Use: Low Risk  (10/29/2022)    Readmission Risk Interventions     No data to display

## 2022-11-02 ENCOUNTER — Encounter (HOSPITAL_COMMUNITY): Payer: Self-pay | Admitting: Cardiology

## 2022-11-02 ENCOUNTER — Other Ambulatory Visit (HOSPITAL_COMMUNITY): Payer: Self-pay

## 2022-11-02 LAB — BASIC METABOLIC PANEL
Anion gap: 8 (ref 5–15)
BUN: 12 mg/dL (ref 8–23)
CO2: 21 mmol/L — ABNORMAL LOW (ref 22–32)
Calcium: 8.7 mg/dL — ABNORMAL LOW (ref 8.9–10.3)
Chloride: 104 mmol/L (ref 98–111)
Creatinine, Ser: 1.03 mg/dL (ref 0.61–1.24)
GFR, Estimated: >60 mL/min (ref >60)
Glucose, Bld: 106 mg/dL — ABNORMAL HIGH (ref 70–99)
Potassium: 4.3 mmol/L (ref 3.5–5.1)
Sodium: 133 mmol/L — ABNORMAL LOW (ref 135–145)

## 2022-11-02 LAB — GLUCOSE, CAPILLARY: Glucose-Capillary: 93 mg/dL (ref 70–99)

## 2022-11-02 LAB — MAGNESIUM: Magnesium: 2.5 mg/dL — ABNORMAL HIGH (ref 1.7–2.4)

## 2022-11-02 MED ORDER — METFORMIN HCL 500 MG PO TABS: 500.0000 mg | ORAL_TABLET | Freq: Two times a day (BID) | ORAL | Status: AC

## 2022-11-02 MED ORDER — LACTINEX PO CHEW: 1.0000 | CHEWABLE_TABLET | Freq: Every day | ORAL | Status: AC

## 2022-11-02 MED ORDER — POLYETHYLENE GLYCOL 3350 17 G PO PACK: 17.0000 g | PACK | Freq: Every day | ORAL | 0 refills | Status: AC

## 2022-11-02 MED ORDER — SODIUM CHLORIDE 0.9% FLUSH
3.0000 mL | Freq: Two times a day (BID) | INTRAVENOUS | Status: DC
  Administered 2022-11-02: 3 mL via INTRAVENOUS

## 2022-11-02 MED ORDER — AMIODARONE HCL 200 MG PO TABS
ORAL_TABLET | ORAL | 0 refills | Status: DC
  Filled 2022-11-02: qty 84, 28d supply, fill #0

## 2022-11-02 MED ORDER — ASPIRIN EC 81 MG PO TBEC: 81.0000 mg | DELAYED_RELEASE_TABLET | Freq: Every day | ORAL | 11 refills | Status: DC

## 2022-11-02 MED ORDER — SODIUM CHLORIDE 0.9 % IV SOLN: 250.0000 mL | INTRAVENOUS | Status: DC | PRN

## 2022-11-02 MED ORDER — SODIUM CHLORIDE 0.9% FLUSH: 3.0000 mL | INTRAVENOUS | Status: DC | PRN

## 2022-11-02 NOTE — Plan of Care (Signed)
  Problem: Education: Goal: Ability to describe self-care measures that may prevent or decrease complications (Diabetes Survival Skills Education) will improve Outcome: Adequate for Discharge Goal: Individualized Educational Video(s) Outcome: Adequate for Discharge   Problem: Coping: Goal: Ability to adjust to condition or change in health will improve Outcome: Adequate for Discharge   Problem: Fluid Volume: Goal: Ability to maintain a balanced intake and output will improve Outcome: Adequate for Discharge   Problem: Health Behavior/Discharge Planning: Goal: Ability to identify and utilize available resources and services will improve Outcome: Adequate for Discharge Goal: Ability to manage health-related needs will improve Outcome: Adequate for Discharge   Problem: Metabolic: Goal: Ability to maintain appropriate glucose levels will improve Outcome: Adequate for Discharge   Problem: Nutritional: Goal: Maintenance of adequate nutrition will improve Outcome: Adequate for Discharge Goal: Progress toward achieving an optimal weight will improve Outcome: Adequate for Discharge   Problem: Skin Integrity: Goal: Risk for impaired skin integrity will decrease Outcome: Adequate for Discharge   Problem: Tissue Perfusion: Goal: Adequacy of tissue perfusion will improve Outcome: Adequate for Discharge   Problem: Education: Goal: Knowledge of General Education information will improve Description: Including pain rating scale, medication(s)/side effects and non-pharmacologic comfort measures Outcome: Adequate for Discharge   Problem: Health Behavior/Discharge Planning: Goal: Ability to manage health-related needs will improve Outcome: Adequate for Discharge   Problem: Clinical Measurements: Goal: Ability to maintain clinical measurements within normal limits will improve Outcome: Adequate for Discharge Goal: Will remain free from infection Outcome: Adequate for Discharge Goal:  Diagnostic test results will improve Outcome: Adequate for Discharge Goal: Respiratory complications will improve Outcome: Adequate for Discharge Goal: Cardiovascular complication will be avoided Outcome: Adequate for Discharge   Problem: Activity: Goal: Risk for activity intolerance will decrease Outcome: Adequate for Discharge   Problem: Nutrition: Goal: Adequate nutrition will be maintained Outcome: Adequate for Discharge   Problem: Coping: Goal: Level of anxiety will decrease Outcome: Adequate for Discharge   Problem: Elimination: Goal: Will not experience complications related to bowel motility Outcome: Adequate for Discharge Goal: Will not experience complications related to urinary retention Outcome: Adequate for Discharge   Problem: Pain Managment: Goal: General experience of comfort will improve Outcome: Adequate for Discharge   Problem: Safety: Goal: Ability to remain free from injury will improve Outcome: Adequate for Discharge   Problem: Skin Integrity: Goal: Risk for impaired skin integrity will decrease Outcome: Adequate for Discharge   Problem: Education: Goal: Understanding of CV disease, CV risk reduction, and recovery process will improve Outcome: Adequate for Discharge Goal: Individualized Educational Video(s) Outcome: Adequate for Discharge   Problem: Activity: Goal: Ability to return to baseline activity level will improve Outcome: Adequate for Discharge   Problem: Cardiovascular: Goal: Ability to achieve and maintain adequate cardiovascular perfusion will improve Outcome: Adequate for Discharge Goal: Vascular access site(s) Level 0-1 will be maintained Outcome: Adequate for Discharge   Problem: Health Behavior/Discharge Planning: Goal: Ability to safely manage health-related needs after discharge will improve Outcome: Adequate for Discharge   

## 2022-11-02 NOTE — Progress Notes (Signed)
Explained discharge instructions to patient. Reviewed follow up appointment and next medication administration times. Also reviewed education. Patient verbalized having an understanding for instructions given. All belongings are in the patient's possession to include TOC meds. IV and telemetry were removed. CCMD was notified. No other needs verbalized. Transported downstairs for discharge. 

## 2022-11-02 NOTE — Discharge Summary (Signed)
ELECTROPHYSIOLOGY DISCHARGE SUMMARY    Patient ID: Chad Gibson,  MRN: [id number], DOB/AGE: October 06, 1940 82 y.o.  Admit date: 10/29/2022 Discharge date: 11/02/2022  [facility] Phys[facility]: [facility]  Primary Cardiologist: [facility]  Electrophysiologist: [facility] - > Dr. Caryl Comes  Primary Discharge Diagnosis:  VT storm  Secondary Discharge Diagnosis:  PVCs Chronic systolic CHF  Procedures This Admission:  Echo 10/30/2022 LVEF 30-35%, mildly elevated PASP, Mild MR  [facility] 11/01/2022  No angiographic evidence of CAD.  Mild PAH, Mean PA 32 mmHg with PCWP and LVEDP of 18 mmHg     Brief HPI: Chad Gibson is a 82 y.o. male with a history of HFrEF, VT, frequent PVCs, HTN, hypothyroidism, s/p MDT single chamber ICD admitted for VT storm.   Hospital Course:  The patient was admitted with complaints of tachycardia and after his device clinic at the [facility] informed them of frequent VT with ATP for the past several days and at least 1 shock.  In the setting of VT storm and VT under detection he was loaded on IV amiodarone and zones were adjusted for therapies starting as low as 125 bpm. 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 Pt transitioned to po amiodarone and close follow up made.  They were monitored on telemetry closely which demonstrated VT ~119-122 bpm Friday evening into Saturday. His device was not programmed any lower than 125, as he had no further VT or NSVT as amiodarone continued to load.  The patient was examined and considered to be stable for discharge.  The patient will be seen back by EP APP in 2 weeks for post hospital care.   He will hold his metformin for 48 hours post cath. He is otherwise on good GDMT. Consider MRA as outpatient. Consider Bidil as tolerated/if needed.  Pt on BB, ARNi, and SGLT2i.   We did review no driving x 6 months per NCDMV guidelines.   Physical Exam: Vitals:   11/01/22 2000  11/01/22 2215 11/02/22 0352 11/02/22 0748  BP: 115/73 110/64 125/68 120/70  Pulse: 73  64 64  Resp: 17 18 18 16   Temp: 99.1 F (37.3 C) 99.1 F (37.3 C) 97.8 F (36.6 C) 97.8 F (36.6 C)  TempSrc: Oral Oral Oral Oral  SpO2: 94% 94% 95% 97%  Weight:   91.5 kg   Height:        GEN- NAD. A&O x 3.  HEENT: Normocephalic, atraumatic Lungs- CTAB, Normal effort.  Heart- RRR, No M/G/R.  GI- Soft, NT, ND.  Extremities- No clubbing, cyanosis, or edema; Groin without hematoma   Discharge Medications:  Allergies as of 11/02/2022       Reactions   Ace Inhibitors Cough        Medication List     TAKE these medications    amiodarone 400 MG tablet Commonly known as: PACERONE Take 1 tablet (400 mg total) by mouth 2 (two) times daily for 14 days, THEN 1 tablet (400 mg total) daily for 14 days. Start taking on: November 02, 2022   ascorbic acid 500 MG tablet Commonly known as: VITAMIN C Take 500 mg by mouth daily.   aspirin EC 81 MG tablet Take 1 tablet (81 mg total) by mouth daily.   carvedilol 12.5 MG tablet Commonly known as: COREG Take 12.5 mg by mouth 2 (two) times daily with a meal.   Eliquis 5 MG Tabs tablet Generic drug: apixaban Take 5 mg  by mouth 2 (two) times daily.   finasteride 5 MG tablet Commonly known as: PROSCAR Take 5 mg by mouth at bedtime.   Glycerin-Hypromellose-PEG 400 0.2-0.2-1 % Soln Place 2 drops into both eyes as needed (Dry eye). refresh - as needed   hydrocortisone 25 MG suppository Commonly known as: ANUSOL-HC Place 1 suppository (25 mg total) rectally at bedtime. What changed:  when to take this reasons to take this   Jardiance 25 MG Tabs tablet Generic drug: empagliflozin Take 12.5 mg by mouth daily.   lactobacillus acidophilus & bulgar chewable tablet Chew 1 tablet by mouth daily.   levothyroxine 50 MCG tablet Commonly known as: SYNTHROID Take 50 mcg by mouth daily before breakfast.   Magnesium 400 MG Caps Take 800 mg by mouth  at bedtime.   meclizine 25 MG tablet Commonly known as: ANTIVERT Take 1 tablet (25 mg total) by mouth 3 (three) times daily as needed for dizziness.   meloxicam 15 MG tablet Commonly known as: MOBIC Take 1 tablet (15 mg total) by mouth daily. What changed:  when to take this reasons to take this   metFORMIN 500 MG tablet Commonly known as: GLUCOPHAGE Take 1 tablet (500 mg total) by mouth 2 (two) times daily with a meal. Start taking on: November 04, 2022 What changed: These instructions start on November 04, 2022. If you are unsure what to do until then, ask your doctor or other care provider.   multivitamin with minerals Tabs tablet Take 1 tablet by mouth daily. Centrum Silver   polyethylene glycol 17 g packet Commonly known as: MIRALAX / GLYCOLAX Take 17 g by mouth daily. PRN   pravastatin 10 MG tablet Commonly known as: PRAVACHOL Take 10 mg by mouth at bedtime.   PROBIOTIC PO Take 1 capsule by mouth daily.   sacubitril-valsartan 49-51 MG Commonly known as: ENTRESTO Take 1 tablet by mouth 2 (two) times daily.   tamsulosin 0.4 MG Caps capsule Commonly known as: FLOMAX Take 0.4 mg by mouth at bedtime.   TUMS PO Take 2 tablets by mouth as needed (heartburn).   Vitamin D 50 MCG (2000 UT) tablet Take 2,000 Units by mouth daily.        Disposition:    Follow-up Information     Shirley Friar, PA-C Follow up.   Specialty: Cardiology Why: on 4/2 at 1040 for post hospital follow up Contact information: [address] [place] [address] [phone]                 Duration of Discharge Encounter: Greater than 30 minutes including physician time.  Jacalyn Lefevre, PA-C  11/02/2022 8:39 AM

## 2022-11-03 LAB — LIPOPROTEIN A (LPA): Lipoprotein (a): 20.6 nmol/L (ref <75.0)

## 2022-11-09 ENCOUNTER — Telehealth: Payer: Self-pay | Admitting: Internal Medicine

## 2022-11-09 NOTE — Telephone Encounter (Signed)
Spoke with pt's wife, DPR and advised of Chad Gibson's message as below.  Pt's wife verbalizes understanding, repeated instructions back to RN and agrees with current plan.  Pt will plan to keep appointment as scheduled with PA on 11/16/2022.

## 2022-11-09 NOTE — Telephone Encounter (Signed)
Spoke with pt's wife, DPR who reports pt's energy level is extremely low since discharge.  Pt and pt's wife feels it is due to Amiodarone.   Pt denies current CP, SOB, edema or dizziness.  BP and HR WNL. Pt's wife states pt is content to just sit.  Pt is becoming increasingly frustrated due to his lack of energy.  Pt is scheduled to see Oda Kilts, PA-C on 11/16/2022.   Pt's wife advised due to pt's recent hospitalization for VT storm and reduced EF  per echo it may take some time for pt to regain his energy level.  Pt's wife requests that Oda Kilts, PA-C review concern and further advise.  Will forward to PA as requested.  Pt's wife verbalizes understanding and thanked Therapist, sports for the callback.

## 2022-11-09 NOTE — Telephone Encounter (Signed)
Pt c/o medication issue:  1. Name of Medication:   amiodarone (PACERONE) 200 MG tablet   2. How are you currently taking this medication (dosage and times per day)?   As prescribed  3. Are you having a reaction (difficulty breathing--STAT)?     4. What is your medication issue?    Wife stated patient was prescribed this medication and has not been able to function.  Wife states patient is extremely tired and is concerned about medication dosage.  Wife noted patient was hospitalized last Tuesday (3/19) for a UTI.

## 2022-11-15 NOTE — Progress Notes (Unsigned)
Electrophysiology Office Note:   Date:  11/16/2022  ID:  Chad Gibson, DOB 1941/05/27, MRN [id number]  Primary Cardiologist: None [facility]rophys[facility]ologist [facility] Dr[facility] Caryl Comes.   History of Present Illness:   Chad Gibson is a 82 y.o. male with h/o chronic systolic CHF, MDT ICD for secondary prevention, PVCs, VT, HTN, Hypothyroidism, arthritis, and BPH seen today for post hospital follow up.    Admitted 3/15 - 3/19 with VT storm. His device clinic alerted him and sent him to ED. They had been discussing PVC ablation and avoiding medications, but with VT storm felt prudent to start amiodarone. Device was also adjusted with VT zone at 125 bpm. Cath without CAD.  He had slow VT in the 119-122 range, but therapy was not adjusted to this range in hope that amiodarone would extinguish it.   Since discharge from hospital the patient reports doing fatigue mostly. He has also noticed somewhat of a decreased appetite and some imbalance. Walking with a cane. No syncope or ICD shock. They sent a transmission to [place] after discharge and were told everything looks [place]. They have a virtual appointment 4/12 that was previously schedule to discuss the possibility of PVC (which would not likely include VT) ablation. Not very active since getting out of the hospital.  Complete 2 weeks of 400 mg BID amio and just started 200 mg BID today. Wife says they are just taking things one day at a time. They feel like their life has changed so much.   Review of systems complete and found to be negative unless listed in HPI.   Device History: DEVICE HISTORY: MDT [place] implanted 04/2020 for secondary prevention, at the [place]   Studies Reviewed:    ICD Interrogation-  reviewed in detail today,  See PACEART report.  EKG is not ordered today  LHC 11/01/2022 Normal cors. Mild PAH, Mean PA 32 mmHf, PCWP/LVEDP of 18 mmHg  Risk Assessment/Calculations:            Physical Exam:   VS:  BP 124/76   Pulse 60   Ht 6\' 1"  (1.854 m)   Wt 194 lb  9.6 oz (88.3 kg)   SpO2 98%   BMI 25.67 kg/m    Wt Readings from Last 3 Encounters:  11/16/22 194 lb 9.6 oz (88.3 kg)  11/02/22 201 lb 12.8 oz (91.5 kg)  10/14/20 215 lb (97.5 kg)     GEN: Well nourished, well developed in no acute distress NECK: No JVD; No carotid bruits CARDIAC: Regular rate and rhythm, no murmurs, rubs, gallops RESPIRATORY:  Clear to auscultation without rales, wheezing or rhonchi  ABDOMEN: Soft, non-tender, non-distended EXTREMITIES:  No edema; No deformity   ASSESSMENT AND PLAN:    Chronic systolic dysfunction s/p Medtronic dual chamber ICD  Echo 10/30/2022 LVEF 30-35%  euvolemic today Stable on an appropriate medical regimen Normal ICD function See printed ICD report No changes today  VT storm PVCs No further VT on device interrogation today.  Decrease amiodarone to 200 mg daily given that he is possibly experiencing off target effects of imbalance and decreased appetite, though could just as likely be 2/2 to his cardiomyopathy.  Labs today.   Paroxymal atrial fib Continue eliquis for CHA2DS2VASc of at least 4.   Poor appetite Imbalance Multifactorial vs possible off target effects from amiodarone. Decreasing today.  Labs and follow for improvement   Disposition:   Follow up with Dr. Caryl Comes in 4 weeks. He is not currently enrolled in our device clinic. [facility]  seeing [facility]. See printed and scanned ICD check.    Signed, Shirley Friar, PA-C

## 2022-11-16 ENCOUNTER — Ambulatory Visit: Payer: Medicare Other | Attending: Student | Admitting: Student

## 2022-11-16 ENCOUNTER — Encounter: Payer: Self-pay | Admitting: Student

## 2022-11-16 VITALS — BP 124/76 | HR 60 | Ht 73.0 in | Wt 194.6 lb

## 2022-11-16 DIAGNOSIS — I472 Ventricular tachycardia, unspecified: Secondary | ICD-10-CM

## 2022-11-16 DIAGNOSIS — I420 Dilated cardiomyopathy: Secondary | ICD-10-CM | POA: Diagnosis present

## 2022-11-16 DIAGNOSIS — I480 Paroxysmal atrial fibrillation: Secondary | ICD-10-CM | POA: Diagnosis present

## 2022-11-16 MED ORDER — AMIODARONE HCL 200 MG PO TABS: 200.0000 mg | ORAL_TABLET | Freq: Every day | ORAL | 3 refills | Status: DC

## 2022-11-16 NOTE — Patient Instructions (Signed)
Medication Instructions:  1.Decrease amiodarone (Pacerone) to 200 mg daily *If you need a refill on your cardiac medications before your next appointment, please call your pharmacy*  Lab Work: CMET, CBC, TSH, FreeT4--TODAY If you have labs (blood work) drawn today and your tests are completely normal, you will receive your results only by: [place] (if you have MyChart) OR A paper copy in the mail If you have any lab test that is abnormal or we need to change your treatment, we will call you to review the results.  Follow-Up: At [facility], you and your health needs are our priority.  As part of our continuing mission to provide you with exceptional heart care, we have created designated Provider Care Teams.  These Care Teams include your primary Cardiologist (physician) and Advanced Practice Providers (APPs -  Physician Assistants and Nurse Practitioners) who all work together to provide you with the care you need, when you need it.  We recommend signing up for the patient portal called "MyChart".  Sign up information is provided on this After Visit Summary.  MyChart is used to connect with patients for Virtual Visits (Telemedicine).  Patients are able to view lab/test results, encounter notes, upcoming appointments, etc.  Non-urgent messages can be sent to your provider as well.   To learn more about what you can do with MyChart, go to [email or web address].    Your next appointment:   12/24/2022 at 2:45 PM  Provider:   Virl Axe, MD

## 2022-11-17 LAB — CBC
Hematocrit: 49.6 % (ref 37.5–51.0)
Hemoglobin: 15.4 g/dL (ref 13.0–17.7)
MCH: 25.5 pg — ABNORMAL LOW (ref 26.6–33.0)
MCHC: 31 g/dL — ABNORMAL LOW (ref 31.5–35.7)
MCV: 82 fL (ref 79–97)
Platelets: 255 10*3/uL (ref 150–450)
RBC: 6.04 x10E6/uL — ABNORMAL HIGH (ref 4.14–5.80)
RDW: 17.6 % — ABNORMAL HIGH (ref 11.6–15.4)
WBC: 8.5 10*3/uL (ref 3.4–10.8)

## 2022-11-17 LAB — COMPREHENSIVE METABOLIC PANEL
ALT: 20 IU/L (ref 0–44)
AST: 18 IU/L (ref 0–40)
Albumin/Globulin Ratio: 1.8 (ref 1.2–2.2)
Albumin: 4.9 g/dL — ABNORMAL HIGH (ref 3.7–4.7)
Alkaline Phosphatase: 84 IU/L (ref 44–121)
BUN/Creatinine Ratio: 15 (ref 10–24)
BUN: 18 mg/dL (ref 8–27)
Bilirubin Total: 0.4 mg/dL (ref 0.0–1.2)
CO2: 25 mmol/L (ref 20–29)
Calcium: 10.5 mg/dL — ABNORMAL HIGH (ref 8.6–10.2)
Chloride: 100 mmol/L (ref 96–106)
Creatinine, Ser: 1.17 mg/dL (ref 0.76–1.27)
Globulin, Total: 2.7 g/dL (ref 1.5–4.5)
Glucose: 107 mg/dL — ABNORMAL HIGH (ref 70–99)
Potassium: 4.8 mmol/L (ref 3.5–5.2)
Sodium: 139 mmol/L (ref 134–144)
Total Protein: 7.6 g/dL (ref 6.0–8.5)
eGFR: 62 mL/min/{1.73_m2} (ref >59)

## 2022-11-17 LAB — T4, FREE: Free T4: 1.58 ng/dL (ref 0.82–1.77)

## 2022-11-17 LAB — TSH: TSH: 4.19 u[IU]/mL (ref 0.450–4.500)

## 2022-11-30 ENCOUNTER — Other Ambulatory Visit: Payer: Self-pay

## 2022-12-24 ENCOUNTER — Encounter: Payer: Medicare Other | Admitting: Internal Medicine

## 2023-08-15 ENCOUNTER — Other Ambulatory Visit (HOSPITAL_COMMUNITY): Payer: Self-pay

## 2023-11-24 ENCOUNTER — Encounter (HOSPITAL_COMMUNITY): Payer: Self-pay

## 2023-11-24 ENCOUNTER — Observation Stay (HOSPITAL_COMMUNITY)
Admission: EM | Admit: 2023-11-24 | Discharge: 2023-11-25 | Disposition: A | Discharge status: Home / Self Care | Attending: Internal Medicine | Admitting: Internal Medicine

## 2023-11-24 ENCOUNTER — Emergency Department (HOSPITAL_COMMUNITY)

## 2023-11-24 ENCOUNTER — Other Ambulatory Visit: Payer: Self-pay

## 2023-11-24 DIAGNOSIS — I110 Hypertensive heart disease with heart failure: Secondary | ICD-10-CM | POA: Insufficient documentation

## 2023-11-24 DIAGNOSIS — Z79899 Other long term (current) drug therapy: Secondary | ICD-10-CM | POA: Insufficient documentation

## 2023-11-24 DIAGNOSIS — E039 Hypothyroidism, unspecified: Secondary | ICD-10-CM | POA: Diagnosis not present

## 2023-11-24 DIAGNOSIS — E119 Type 2 diabetes mellitus without complications: Secondary | ICD-10-CM | POA: Diagnosis not present

## 2023-11-24 DIAGNOSIS — R55 Syncope and collapse: Principal | ICD-10-CM | POA: Insufficient documentation

## 2023-11-24 DIAGNOSIS — I472 Ventricular tachycardia, unspecified: Secondary | ICD-10-CM | POA: Diagnosis not present

## 2023-11-24 DIAGNOSIS — I5022 Chronic systolic (congestive) heart failure: Secondary | ICD-10-CM | POA: Diagnosis not present

## 2023-11-24 DIAGNOSIS — Z7984 Long term (current) use of oral hypoglycemic drugs: Secondary | ICD-10-CM | POA: Diagnosis not present

## 2023-11-24 DIAGNOSIS — Z95810 Presence of automatic (implantable) cardiac defibrillator: Secondary | ICD-10-CM | POA: Insufficient documentation

## 2023-11-24 LAB — COMPREHENSIVE METABOLIC PANEL WITH GFR
ALT: 23 U/L (ref 0–44)
AST: 26 U/L (ref 15–41)
Albumin: 3.8 g/dL (ref 3.5–5.0)
Alkaline Phosphatase: 44 U/L (ref 38–126)
Anion gap: 12 (ref 5–15)
BUN: 17 mg/dL (ref 8–23)
CO2: 25 mmol/L (ref 22–32)
Calcium: 9.4 mg/dL (ref 8.9–10.3)
Chloride: 102 mmol/L (ref 98–111)
Creatinine, Ser: 1.1 mg/dL (ref 0.61–1.24)
GFR, Estimated: >60 mL/min (ref >60)
Glucose, Bld: 117 mg/dL — ABNORMAL HIGH (ref 70–99)
Potassium: 4.6 mmol/L (ref 3.5–5.1)
Sodium: 139 mmol/L (ref 135–145)
Total Bilirubin: 0.4 mg/dL (ref 0.0–1.2)
Total Protein: 6.6 g/dL (ref 6.5–8.1)

## 2023-11-24 LAB — CBC
HCT: 41.2 % (ref 39.0–52.0)
Hemoglobin: 13.7 g/dL (ref 13.0–17.0)
MCH: 25.8 pg — ABNORMAL LOW (ref 26.0–34.0)
MCHC: 33.3 g/dL (ref 30.0–36.0)
MCV: 77.6 fL — ABNORMAL LOW (ref 80.0–100.0)
Platelets: 145 10*3/uL — ABNORMAL LOW (ref 150–400)
RBC: 5.31 MIL/uL (ref 4.22–5.81)
RDW: 17.8 % — ABNORMAL HIGH (ref 11.5–15.5)
WBC: 7 10*3/uL (ref 4.0–10.5)
nRBC: 0 % (ref 0.0–0.2)

## 2023-11-24 LAB — CBC WITH DIFFERENTIAL/PLATELET
Abs Immature Granulocytes: 0.01 10*3/uL (ref 0.00–0.07)
Basophils Absolute: 0 10*3/uL (ref 0.0–0.1)
Basophils Relative: 1 %
Eosinophils Absolute: 0.2 10*3/uL (ref 0.0–0.5)
Eosinophils Relative: 3 %
HCT: 41.7 % (ref 39.0–52.0)
Hemoglobin: 13.6 g/dL (ref 13.0–17.0)
Immature Granulocytes: 0 %
Lymphocytes Relative: 25 %
Lymphs Abs: 1.5 10*3/uL (ref 0.7–4.0)
MCH: 25.9 pg — ABNORMAL LOW (ref 26.0–34.0)
MCHC: 32.6 g/dL (ref 30.0–36.0)
MCV: 79.4 fL — ABNORMAL LOW (ref 80.0–100.0)
Monocytes Absolute: 0.5 10*3/uL (ref 0.1–1.0)
Monocytes Relative: 8 %
Neutro Abs: 3.8 10*3/uL (ref 1.7–7.7)
Neutrophils Relative %: 63 %
Platelets: 146 10*3/uL — ABNORMAL LOW (ref 150–400)
RBC: 5.25 MIL/uL (ref 4.22–5.81)
RDW: 18.1 % — ABNORMAL HIGH (ref 11.5–15.5)
WBC: 6 10*3/uL (ref 4.0–10.5)
nRBC: 0 % (ref 0.0–0.2)

## 2023-11-24 LAB — CREATININE, SERUM
Creatinine, Ser: 1.02 mg/dL (ref 0.61–1.24)
GFR, Estimated: >60 mL/min (ref >60)

## 2023-11-24 LAB — MAGNESIUM: Magnesium: 2.5 mg/dL — ABNORMAL HIGH (ref 1.7–2.4)

## 2023-11-24 MED ORDER — MAGNESIUM OXIDE -MG SUPPLEMENT 400 (240 MG) MG PO TABS
800.0000 mg | ORAL_TABLET | Freq: Every day | ORAL | Status: DC
  Administered 2023-11-24: 800 mg via ORAL
  Filled 2023-11-24: qty 2

## 2023-11-24 MED ORDER — PRAVASTATIN SODIUM 10 MG PO TABS
10.0000 mg | ORAL_TABLET | Freq: Every day | ORAL | Status: DC
  Administered 2023-11-24: 10 mg via ORAL
  Filled 2023-11-24: qty 1

## 2023-11-24 MED ORDER — METFORMIN HCL 500 MG PO TABS
500.0000 mg | ORAL_TABLET | Freq: Two times a day (BID) | ORAL | Status: DC
  Administered 2023-11-25: 500 mg via ORAL
  Filled 2023-11-24: qty 1

## 2023-11-24 MED ORDER — POLYVINYL ALCOHOL 1.4 % OP SOLN
2.0000 [drp] | OPHTHALMIC | Status: DC | PRN
Start: 2023-11-24 — End: 2023-11-25

## 2023-11-24 MED ORDER — FINASTERIDE 5 MG PO TABS
5.0000 mg | ORAL_TABLET | Freq: Every day | ORAL | Status: DC
  Administered 2023-11-24: 5 mg via ORAL
  Filled 2023-11-24: qty 1

## 2023-11-24 MED ORDER — CARVEDILOL 12.5 MG PO TABS
12.5000 mg | ORAL_TABLET | Freq: Two times a day (BID) | ORAL | Status: DC
Start: 2023-11-25 — End: 2023-11-25
  Filled 2023-11-24: qty 1

## 2023-11-24 MED ORDER — SODIUM CHLORIDE 0.9% FLUSH
3.0000 mL | Freq: Two times a day (BID) | INTRAVENOUS | Status: DC
  Administered 2023-11-24 – 2023-11-25 (×2): 3 mL via INTRAVENOUS

## 2023-11-24 MED ORDER — AMIODARONE HCL IN DEXTROSE 360-4.14 MG/200ML-% IV SOLN
30.0000 mg/h | INTRAVENOUS | Status: DC
  Administered 2023-11-24 (×2): 30 mg/h via INTRAVENOUS

## 2023-11-24 MED ORDER — LEVOTHYROXINE SODIUM 50 MCG PO TABS
50.0000 ug | ORAL_TABLET | Freq: Every day | ORAL | Status: DC
  Administered 2023-11-25: 50 ug via ORAL
  Filled 2023-11-24: qty 1

## 2023-11-24 MED ORDER — EMPAGLIFLOZIN 10 MG PO TABS
10.0000 mg | ORAL_TABLET | Freq: Every day | ORAL | Status: DC
  Administered 2023-11-25: 10 mg via ORAL
  Filled 2023-11-24: qty 1

## 2023-11-24 MED ORDER — ACETAMINOPHEN 325 MG PO TABS: 650.0000 mg | ORAL_TABLET | ORAL | Status: DC | PRN

## 2023-11-24 MED ORDER — CALCIUM CARBONATE ANTACID 500 MG PO CHEW: 1.0000 | CHEWABLE_TABLET | ORAL | Status: DC | PRN

## 2023-11-24 MED ORDER — AMIODARONE HCL IN DEXTROSE 360-4.14 MG/200ML-% IV SOLN
60.0000 mg/h | INTRAVENOUS | Status: AC
  Administered 2023-11-24: 60 mg/h via INTRAVENOUS
  Filled 2023-11-24 (×2): qty 200

## 2023-11-24 MED ORDER — SODIUM CHLORIDE 0.9 % IV SOLN: 250.0000 mL | INTRAVENOUS | Status: DC | PRN

## 2023-11-24 MED ORDER — ENOXAPARIN SODIUM 40 MG/0.4ML IJ SOSY
40.0000 mg | PREFILLED_SYRINGE | INTRAMUSCULAR | Status: DC
  Administered 2023-11-24: 40 mg via SUBCUTANEOUS
  Filled 2023-11-24: qty 0.4

## 2023-11-24 MED ORDER — POLYETHYLENE GLYCOL 3350 17 G PO PACK
17.0000 g | PACK | Freq: Every day | ORAL | Status: DC
  Administered 2023-11-24 – 2023-11-25 (×2): 17 g via ORAL
  Filled 2023-11-24 (×2): qty 1

## 2023-11-24 MED ORDER — SODIUM CHLORIDE 0.9% FLUSH: 3.0000 mL | INTRAVENOUS | Status: DC | PRN

## 2023-11-24 MED ORDER — MECLIZINE HCL 25 MG PO TABS: 25.0000 mg | ORAL_TABLET | Freq: Three times a day (TID) | ORAL | Status: DC | PRN

## 2023-11-24 MED ORDER — ONDANSETRON HCL 4 MG/2ML IJ SOLN
4.0000 mg | Freq: Four times a day (QID) | INTRAMUSCULAR | Status: DC | PRN
Start: 2023-11-24 — End: 2023-11-25

## 2023-11-24 MED ORDER — SACUBITRIL-VALSARTAN 49-51 MG PO TABS
1.0000 | ORAL_TABLET | Freq: Two times a day (BID) | ORAL | Status: DC
  Administered 2023-11-24 – 2023-11-25 (×2): 1 via ORAL
  Filled 2023-11-24 (×2): qty 1

## 2023-11-24 MED ORDER — TAMSULOSIN HCL 0.4 MG PO CAPS
0.4000 mg | ORAL_CAPSULE | Freq: Every day | ORAL | Status: DC
  Administered 2023-11-24: 0.4 mg via ORAL
  Filled 2023-11-24: qty 1
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
## 2023-11-24 NOTE — ED Notes (Signed)
 PA at bedside.
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
## 2023-11-24 NOTE — ED Notes (Signed)
 Called Chad Gibson [phone] medtronic person

## 2023-11-24 NOTE — ED Notes (Signed)
 XR at bedside

## 2023-11-24 NOTE — ED Provider Notes (Signed)
 Pt signed out by Dr. Criss Alvine pending cardiology eval.  Pt was seen by the EP team who will admit.   Jacalyn Lefevre, MD 11/24/23 [phone]

## 2023-11-25 ENCOUNTER — Encounter: Payer: Self-pay | Admitting: Student

## 2023-11-25 DIAGNOSIS — I472 Ventricular tachycardia, unspecified: Secondary | ICD-10-CM | POA: Diagnosis not present

## 2023-11-25 LAB — BASIC METABOLIC PANEL WITH GFR
Anion gap: 10 (ref 5–15)
BUN: 14 mg/dL (ref 8–23)
CO2: 24 mmol/L (ref 22–32)
Calcium: 9.1 mg/dL (ref 8.9–10.3)
Chloride: 105 mmol/L (ref 98–111)
Creatinine, Ser: 1.04 mg/dL (ref 0.61–1.24)
GFR, Estimated: >60 mL/min (ref >60)
Glucose, Bld: 115 mg/dL — ABNORMAL HIGH (ref 70–99)
Potassium: 4.2 mmol/L (ref 3.5–5.1)
Sodium: 139 mmol/L (ref 135–145)

## 2023-11-25 LAB — MAGNESIUM: Magnesium: 2.3 mg/dL (ref 1.7–2.4)

## 2023-11-25 MED ORDER — AMIODARONE HCL 200 MG PO TABS: 200.0000 mg | ORAL_TABLET | Freq: Every day | ORAL | Status: AC

## 2023-11-25 MED ORDER — AMIODARONE HCL 200 MG PO TABS
200.0000 mg | ORAL_TABLET | Freq: Two times a day (BID) | ORAL | Status: DC
  Administered 2023-11-25: 200 mg via ORAL
  Filled 2023-11-25: qty 1

## 2023-11-25 MED ORDER — AMIODARONE HCL 200 MG PO TABS: 200.0000 mg | ORAL_TABLET | Freq: Two times a day (BID) | ORAL | Status: AC

## 2023-11-25 NOTE — TOC CM/SW Note (Signed)
 Transition of Care [facility]) - Inpatient Brief Assessment   Patient Details  Name: Chad Gibson MRN: [id number] Date of Birth: 04/18/1941  [facility]) CM/SW Contact:    Leone Haven, RN Phone Number: 11/25/2023, 2:56 PM   Clinical Narrative: From home with spouse, has PCP and insurance on file, states has no HH services in place at this time , has a cane  at home.  Wife is at bedside and  will transport them home at dc and family is support system, states gets medications from [facility] [place] and for short term [facility],  wife has notified the [place] that he is here.  Pta self ambulatory, sometimes uses the cane.   Transition of Care Asessment: Insurance and Status: Insurance coverage has been reviewed Patient has primary care physician: Yes (Dr. Willa Rough) Home environment has been reviewed: lives with wife Prior level of function:: indep Prior/Current Home Services: No current home services Social Drivers of Health Review: SDOH reviewed no interventions necessary   Transition of care needs: no transition of care needs at this time

## 2023-11-25 NOTE — Care Management Obs Status (Signed)
 MEDICARE OBSERVATION STATUS NOTIFICATION   Patient Details  Name: Chad Gibson MRN: [id number] Date of Birth: November 07, 1940   Medicare Observation Status Notification Given:  Yes    Sherilyn Banker 11/25/2023, 12:59 PM

## 2023-11-25 NOTE — TOC Transition Note (Signed)
 Transition of Care [facility]) - Discharge Note   Patient Details  Name: Chad Gibson MRN: [id number] Date of Birth: 03-16-1941  Transition of Care [facility]) CM/SW Contact:  Leone Haven, RN Phone Number: 11/25/2023, 2:58 PM   Clinical Narrative:    For dc today, he has no needs.          Patient Goals and CMS Choice            Discharge Placement                       Discharge Plan and Services Additional resources added to the After Visit Summary for                                       Social Drivers of Health (SDOH) Interventions SDOH Screenings   Food Insecurity: No Food Insecurity (11/24/2023)  Housing: Low Risk  (11/24/2023)  Transportation Needs: No Transportation Needs (11/24/2023)  Utilities: Not At Risk (11/24/2023)  Financial Resource Strain: Low Risk  (09/28/2022)   Received from [facility], [facility]  Physical Activity: Sufficiently Active (09/28/2022)   Received from [facility], [facility]  Social Connections: Moderately Isolated (11/24/2023)  Stress: No Stress Concern Present (09/28/2022)   Received from [facility], [facility]  Tobacco Use: Low Risk  (11/24/2023)     Readmission Risk Interventions     No data to display

## 2023-11-25 NOTE — Discharge Instructions (Signed)
NO DRIVING 6 MONTHS
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
## 2023-12-26 ENCOUNTER — Ambulatory Visit: Admitting: Student

## 2024-06-18 ENCOUNTER — Encounter: Payer: Self-pay | Admitting: Cardiovascular Disease

## 2024-06-19 ENCOUNTER — Other Ambulatory Visit (HOSPITAL_COMMUNITY): Payer: Self-pay | Admitting: Cardiovascular Disease

## 2024-06-19 DIAGNOSIS — I429 Cardiomyopathy, unspecified: Secondary | ICD-10-CM

## 2024-06-26 ENCOUNTER — Encounter (HOSPITAL_COMMUNITY)
Admission: RE | Admit: 2024-06-26 | Discharge: 2024-06-26 | Disposition: A | Discharge status: Home / Self Care | Source: Ambulatory Visit | Attending: Cardiovascular Disease | Admitting: Cardiovascular Disease

## 2024-06-26 DIAGNOSIS — I429 Cardiomyopathy, unspecified: Secondary | ICD-10-CM | POA: Insufficient documentation
